# Patient Record
Sex: Female | Born: 1980 | Race: White | Smoking: Never smoker
Health system: Southern US, Community
[De-identification: ages and names within clinical notes are randomized; demographics above are authoritative.]

## PROBLEM LIST (undated history)

## (undated) DIAGNOSIS — R32 Unspecified urinary incontinence: Secondary | ICD-10-CM

## (undated) DIAGNOSIS — B019 Varicella without complication: Secondary | ICD-10-CM

## (undated) DIAGNOSIS — R17 Unspecified jaundice: Secondary | ICD-10-CM

## (undated) HISTORY — PX: NO PAST SURGERIES: SHX2092

## (undated) HISTORY — DX: Unspecified jaundice: R17

## (undated) HISTORY — DX: Unspecified urinary incontinence: R32

## (undated) HISTORY — DX: Varicella without complication: B01.9

---

## 2021-02-15 DIAGNOSIS — L578 Other skin changes due to chronic exposure to nonionizing radiation: Secondary | ICD-10-CM | POA: Diagnosis not present

## 2021-02-15 DIAGNOSIS — L814 Other melanin hyperpigmentation: Secondary | ICD-10-CM | POA: Diagnosis not present

## 2021-02-15 DIAGNOSIS — D1801 Hemangioma of skin and subcutaneous tissue: Secondary | ICD-10-CM | POA: Diagnosis not present

## 2021-02-15 DIAGNOSIS — L700 Acne vulgaris: Secondary | ICD-10-CM | POA: Diagnosis not present

## 2021-07-27 ENCOUNTER — Other Ambulatory Visit: Payer: Self-pay

## 2021-08-07 ENCOUNTER — Telehealth: Payer: Self-pay

## 2021-08-14 ENCOUNTER — Ambulatory Visit: Payer: Federal, State, Local not specified - PPO | Admitting: Family Medicine

## 2021-08-27 NOTE — Telephone Encounter (Signed)
ERROR

## 2021-09-03 ENCOUNTER — Encounter: Payer: Self-pay | Admitting: Family Medicine

## 2021-09-03 ENCOUNTER — Ambulatory Visit: Payer: Federal, State, Local not specified - PPO | Admitting: Family Medicine

## 2021-09-03 VITALS — BP 126/78 | HR 75 | Temp 98.5°F | Ht 61.5 in | Wt 143.0 lb

## 2021-09-03 DIAGNOSIS — Z411 Encounter for cosmetic surgery: Secondary | ICD-10-CM

## 2021-09-03 DIAGNOSIS — Z131 Encounter for screening for diabetes mellitus: Secondary | ICD-10-CM

## 2021-09-03 DIAGNOSIS — Z0000 Encounter for general adult medical examination without abnormal findings: Secondary | ICD-10-CM | POA: Diagnosis not present

## 2021-09-03 DIAGNOSIS — H5210 Myopia, unspecified eye: Secondary | ICD-10-CM

## 2021-09-03 DIAGNOSIS — Z8249 Family history of ischemic heart disease and other diseases of the circulatory system: Secondary | ICD-10-CM | POA: Diagnosis not present

## 2021-09-03 DIAGNOSIS — B001 Herpesviral vesicular dermatitis: Secondary | ICD-10-CM | POA: Diagnosis not present

## 2021-09-03 DIAGNOSIS — Z789 Other specified health status: Secondary | ICD-10-CM

## 2021-09-03 DIAGNOSIS — Z7689 Persons encountering health services in other specified circumstances: Secondary | ICD-10-CM | POA: Diagnosis not present

## 2021-09-03 DIAGNOSIS — R635 Abnormal weight gain: Secondary | ICD-10-CM | POA: Diagnosis not present

## 2021-09-03 DIAGNOSIS — Z1231 Encounter for screening mammogram for malignant neoplasm of breast: Secondary | ICD-10-CM

## 2021-09-03 HISTORY — DX: Encounter for cosmetic surgery: Z41.1

## 2021-09-03 HISTORY — DX: Myopia, unspecified eye: H52.10

## 2021-09-03 MED ORDER — VALACYCLOVIR HCL 1 G PO TABS: 2000.0000 mg | ORAL_TABLET | Freq: Two times a day (BID) | ORAL | 3 refills | Status: DC | PRN

## 2021-09-03 MED ORDER — DROSPIRENONE-ETHINYL ESTRADIOL 3-0.03 MG PO TABS
1.0000 | ORAL_TABLET | Freq: Every day | ORAL | 4 refills | Status: DC
Start: 2021-09-03 — End: 2022-10-21

## 2021-09-03 NOTE — Progress Notes (Signed)
? ?This visit occurred during the SARS-CoV-2 public health emergency.  Safety protocols were in place, including screening questions prior to the visit, additional usage of staff PPE, and extensive cleaning of exam room while observing appropriate contact time as indicated for disinfecting solutions.  ? ? ?Patient ID: Janet Braun, female  DOB: [birth year], 41 y.o.   MRN: [id number] ?Patient Care Team  ?  Relationship Specialty Notifications Start End  ?Ma Hillock, DO PCP - General Family Medicine  09/03/21   ? ? ?Chief Complaint  ?Patient presents with  ? Establish Care  ?  Need refills  ? Annual Exam  ?  Pt is fasting  ? ? ?Subjective: ?Janet Braun is a 41 y.o.  Female  present for CPE/EST care. ?All past medical history, surgical history, allergies, family history, immunizations, medications and social history were updated in the electronic medical record today. ?All recent labs, ED visits and hospitalizations within the last year were reviewed. ? ?Health maintenance:  ?Colonoscopy:No fhx  Routine screen at 66 ?Mammogram: fhx [place]. Ordered today for her.  ?Cervical cancer screening: pt reported about 5 years. 3 mos for PAP here if desired.  ?Immunizations: tdap UTD 2018, Influenza UTD 2022 (encouraged yearly), covid x3 completed ?Infectious disease screening: HIV and Hep C completed ?DEXA:routine screen ?Assistive device: none ?Oxygen TFT:DDUK ?Patient has a Dental home. ?Hospitalizations/ED visits: reviewed ? ? ?  09/03/2021  ?  3:08 PM  ?Depression screen PHQ 2/9  ?Decreased Interest 0  ?Down, Depressed, Hopeless 0  ?PHQ - 2 Score 0  ? ?   ? View : No data to display.  ?  ?  ?  ? ? ?Immunization History  ?Administered Date(s) Administered  ? Hep A / Hep B 07/02/1991, 08/25/1991, 02/25/1992  ? IPV 07/29/2006  ? Influenza Split 02/22/2013, 01/24/2015, 02/07/2016  ? Influenza, Seasonal, Injecte, Preservative Fre 01/22/2012, 02/09/2014  ? Influenza,inj,Quad PF,6+ Mos 02/19/2018  ?  Influenza-Unspecified 02/17/2021  ? MMR 11/05/1982, 01/15/1989, 03/12/2012  ? Meningococcal Conjugate 03/12/2012  ? PPD Test 04/27/2012  ? Td 05/22/1996  ? Td (Adult) 05/22/1996  ? Tdap 09/25/2005, 05/28/2016  ? Varicella 04/19/1988  ? ? ?Past Medical History:  ?Diagnosis Date  ? Chicken pox   ? Encounter for cosmetic surgery 09/03/2021  ? Jaundice   ? at birth (pre-term)  ? Myopia 09/03/2021  ? Urinary incontinence   ? ?Not on File ?Past Surgical History:  ?Procedure Laterality Date  ? NO PAST SURGERIES    ? ?Family History  ?Problem Relation Age of Onset  ? Asthma Mother   ? Thyroid cancer Mother   ? Hypertension Father   ? Hyperlipidemia Father   ? Asthma Brother   ? Heart disease Maternal Grandmother   ? Hypertension Maternal Grandmother   ? Hyperlipidemia Maternal Grandmother   ? Heart attack Maternal Grandmother   ? Heart disease Maternal Grandfather   ? Hypertension Maternal Grandfather   ? Hyperlipidemia Maternal Grandfather   ? Lung cancer Maternal Grandfather   ?     smoker  ? Prostate cancer Maternal Grandfather   ? Heart attack Paternal Grandmother   ? Hypertension Paternal Grandmother   ? Hyperlipidemia Paternal Grandmother   ? Heart disease Paternal Grandmother   ? Uterine cancer Paternal Grandmother   ? Heart disease Paternal Grandfather   ? Hypertension Paternal Grandfather   ? Hyperlipidemia Paternal Grandfather   ? Kidney disease Paternal Grandfather   ? Healthy Daughter   ? Breast cancer Maternal Aunt   ? ?  Social History  ? ?Social History Narrative  ? Marital status/children/pets: Single/Divorced,G2P1  ? Education/employment: MD, employed psychiatrist  ? Safety:   ?   -Wears a bicycle helmet riding a bike: Yes  ?   -smoke alarm in the home:Yes  ?   - wears seatbelt: Yes  ?   - Feels safe in their relationships: Yes  ?   ? ?Allergies as of 09/03/2021   ?Not on File ?  ? ?  ?Medication List  ?  ? ?  ? Accurate as of September 03, 2021  3:42 PM. If you have any questions, ask your nurse or doctor.  ?  ?   ? ?  ? ?drospirenone-ethinyl estradiol 3-0.03 MG tablet ?Commonly known as: Syeda ?Take 1 tablet by mouth daily. ?  ?tretinoin 0.025 % cream ?Commonly known as: RETIN-A ?Apply topically. ?  ?valACYclovir 1000 MG tablet ?Commonly known as: VALTREX ?Take 2 tablets (2,000 mg total) by mouth 2 (two) times daily as needed. Cold sore prn ?  ? ?  ? ? ?All past medical history, surgical history, allergies, family history, immunizations andmedications were updated in the EMR today and reviewed under the history and medication portions of their EMR.    ? ?No results found for this or any previous visit (from the past 2160 hour(s)). ? ?Patient was never admitted. ? ?ROS ?14 pt review of systems performed and negative (unless mentioned in an HPI) ? ?Objective: ?BP 126/78   Pulse 75   Temp 98.5 ?F (36.9 ?C) (Oral)   Ht 5' 1.5" (1.562 m)   Wt 143 lb (64.9 kg)   SpO2 99%   BMI 26.58 kg/m?  ?Physical Exam ?Vitals and nursing note reviewed.  ?Constitutional:   ?   General: She is not in acute distress. ?   Appearance: Normal appearance. She is not ill-appearing or toxic-appearing.  ?HENT:  ?   Head: Normocephalic and atraumatic.  ?   Right Ear: Tympanic membrane, ear canal and external ear normal. There is no impacted cerumen.  ?   Left Ear: Tympanic membrane, ear canal and external ear normal. There is no impacted cerumen.  ?   Nose: No congestion or rhinorrhea.  ?   Mouth/Throat:  ?   Mouth: Mucous membranes are moist.  ?   Pharynx: Oropharynx is clear. No oropharyngeal exudate or posterior oropharyngeal erythema.  ?Eyes:  ?   General: No scleral icterus.    ?   Right eye: No discharge.     ?   Left eye: No discharge.  ?   Extraocular Movements: Extraocular movements intact.  ?   Conjunctiva/sclera: Conjunctivae normal.  ?   Pupils: Pupils are equal, round, and reactive to light.  ?Cardiovascular:  ?   Rate and Rhythm: Normal rate and regular rhythm.  ?   Pulses: Normal pulses.  ?   Heart sounds: Normal heart sounds. No  murmur heard. ?  No friction rub. No gallop.  ?Pulmonary:  ?   Effort: Pulmonary effort is normal. No respiratory distress.  ?   Breath sounds: Normal breath sounds. No stridor. No wheezing, rhonchi or rales.  ?Chest:  ?   Chest wall: No tenderness.  ?Abdominal:  ?   General: Abdomen is flat. Bowel sounds are normal. There is no distension.  ?   Palpations: Abdomen is soft. There is no mass.  ?   Tenderness: There is no abdominal tenderness. There is no right CVA tenderness, left CVA tenderness, guarding or rebound.  ?  Hernia: No hernia is present.  ?Musculoskeletal:     ?   General: No swelling, tenderness or deformity. Normal range of motion.  ?   Cervical back: Normal range of motion and neck supple. No rigidity or tenderness.  ?   Right lower leg: No edema.  ?   Left lower leg: No edema.  ?Lymphadenopathy:  ?   Cervical: No cervical adenopathy.  ?Skin: ?   General: Skin is warm and dry.  ?   Coloration: Skin is not jaundiced or pale.  ?   Findings: No bruising, erythema, lesion or rash.  ?Neurological:  ?   General: No focal deficit present.  ?   Mental Status: She is alert and oriented to person, place, and time. Mental status is at baseline.  ?   Cranial Nerves: No cranial nerve deficit.  ?   Sensory: No sensory deficit.  ?   Motor: No weakness.  ?   Coordination: Coordination normal.  ?   Gait: Gait normal.  ?   Deep Tendon Reflexes: Reflexes normal.  ?Psychiatric:     ?   Mood and Affect: Mood normal.     ?   Behavior: Behavior normal.     ?   Thought Content: Thought content normal.     ?   Judgment: Judgment normal.  ?  ? ?No results found. ? ?Assessment/plan: ?Janet Braun is a 41 y.o. female present for CPE/Establishing care with new doctor, encounter for ?Recurrent cold sores ?Valtrex 2g BID PRN ?Uses oral contraceptives as primary birth control method ?- CBC ?- Comp Met (CMET) ?- Lipid panel ?FH: heart disease ?- Lipid panel ?Breast cancer screening by mammogram ?- MM 3D SCREEN BREAST BILATERAL;  Future ?Diabetes mellitus screening ?- Hemoglobin A1c ?Weight gain ?- TSH ?Encounter for preventive health examination ?Colonoscopy:No fhx  Routine screen at 89 ?Mammogram: [place]x [place]. Ordered today for her.  ?Ce

## 2021-09-03 NOTE — Patient Instructions (Signed)
No follow-ups on file.        Great to see you today.  I have refilled the medication(s) we provide.   If labs were collected, we will inform you of lab results once received either by echart message or telephone call.   - echart message- for normal results that have been seen by the patient already.   - telephone call: abnormal results or if patient has not viewed results in their echart.  Health Maintenance, Female Adopting a healthy lifestyle and getting preventive care are important in promoting health and wellness. Ask your health care provider about: The right schedule for you to have regular tests and exams. Things you can do on your own to prevent diseases and keep yourself healthy. What should I know about diet, weight, and exercise? Eat a healthy diet  Eat a diet that includes plenty of vegetables, fruits, low-fat dairy products, and lean protein. Do not eat a lot of foods that are high in solid fats, added sugars, or sodium. Maintain a healthy weight Body mass index (BMI) is used to identify weight problems. It estimates body fat based on height and weight. Your health care provider can help determine your BMI and help you achieve or maintain a healthy weight. Get regular exercise Get regular exercise. This is one of the most important things you can do for your health. Most adults should: Exercise for at least 150 minutes each week. The exercise should increase your heart rate and make you sweat (moderate-intensity exercise). Do strengthening exercises at least twice a week. This is in addition to the moderate-intensity exercise. Spend less time sitting. Even light physical activity can be beneficial. Watch cholesterol and blood lipids Have your blood tested for lipids and cholesterol at 40 years of age, then have this test every 5 years. Have your cholesterol levels checked more often if: Your lipid or cholesterol levels are high. You are older than 40 years of  age. You are at high risk for heart disease. What should I know about cancer screening? Depending on your health history and family history, you may need to have cancer screening at various ages. This may include screening for: Breast cancer. Cervical cancer. Colorectal cancer. Skin cancer. Lung cancer. What should I know about heart disease, diabetes, and high blood pressure? Blood pressure and heart disease High blood pressure causes heart disease and increases the risk of stroke. This is more likely to develop in people who have high blood pressure readings or are overweight. Have your blood pressure checked: Every 3-5 years if you are 18-39 years of age. Every year if you are 40 years old or older. Diabetes Have regular diabetes screenings. This checks your fasting blood sugar level. Have the screening done: Once every three years after age 40 if you are at a normal weight and have a low risk for diabetes. More often and at a younger age if you are overweight or have a high risk for diabetes. What should I know about preventing infection? Hepatitis B If you have a higher risk for hepatitis B, you should be screened for this virus. Talk with your health care provider to find out if you are at risk for hepatitis B infection. Hepatitis C Testing is recommended for: Everyone born from 1945 through 1965. Anyone with known risk factors for hepatitis C. Sexually transmitted infections (STIs) Get screened for STIs, including gonorrhea and chlamydia, if: You are sexually active and are younger than 40 years of age. You are   older than 40 years of age and your health care provider tells you that you are at risk for this type of infection. Your sexual activity has changed since you were last screened, and you are at increased risk for chlamydia or gonorrhea. Ask your health care provider if you are at risk. Ask your health care provider about whether you are at high risk for HIV. Your health  care provider may recommend a prescription medicine to help prevent HIV infection. If you choose to take medicine to prevent HIV, you should first get tested for HIV. You should then be tested every 3 months for as long as you are taking the medicine. Pregnancy If you are about to stop having your period (premenopausal) and you may become pregnant, seek counseling before you get pregnant. Take 400 to 800 micrograms (mcg) of folic acid every day if you become pregnant. Ask for birth control (contraception) if you want to prevent pregnancy. Osteoporosis and menopause Osteoporosis is a disease in which the bones lose minerals and strength with aging. This can result in bone fractures. If you are 65 years old or older, or if you are at risk for osteoporosis and fractures, ask your health care provider if you should: Be screened for bone loss. Take a calcium or vitamin D supplement to lower your risk of fractures. Be given hormone replacement therapy (HRT) to treat symptoms of menopause. Follow these instructions at home: Alcohol use Do not drink alcohol if: Your health care provider tells you not to drink. You are pregnant, may be pregnant, or are planning to become pregnant. If you drink alcohol: Limit how much you have to: 0-1 drink a day. Know how much alcohol is in your drink. In the [place] one drink equals one 12 oz bottle of beer (355 mL), one 5 oz glass of wine (148 mL), or one 1 oz glass of hard liquor (44 mL). Lifestyle Do not use any products that contain nicotine or tobacco. These products include cigarettes, chewing tobacco, and vaping devices, such as e-cigarettes. If you need help quitting, ask your health care provider. Do not use street drugs. Do not share needles. Ask your health care provider for help if you need support or information about quitting drugs. General instructions Schedule regular health, dental, and eye exams. Stay current with your vaccines. Tell your health  care provider if: You often feel depressed. You have ever been abused or do not feel safe at home. Summary Adopting a healthy lifestyle and getting preventive care are important in promoting health and wellness. Follow your health care provider's instructions about healthy diet, exercising, and getting tested or screened for diseases. Follow your health care provider's instructions on monitoring your cholesterol and blood pressure. This information is not intended to replace advice given to you by your health care provider. Make sure you discuss any questions you have with your health care provider. Document Revised: 09/25/2020 Document Reviewed: 09/25/2020 Elsevier Patient Education  2023 Elsevier Inc[employer]  

## 2021-09-04 LAB — COMPREHENSIVE METABOLIC PANEL
AG Ratio: 1.4 (calc) (ref 1.0–2.5)
ALT: 13 U/L (ref 6–29)
AST: 13 U/L (ref 10–30)
Albumin: 4.2 g/dL (ref 3.6–5.1)
Alkaline phosphatase (APISO): 34 U/L (ref 31–125)
BUN/Creatinine Ratio: 16 (calc) (ref 6–22)
BUN: 16 mg/dL (ref 7–25)
CO2: 26 mmol/L (ref 20–32)
Calcium: 9.6 mg/dL (ref 8.6–10.2)
Chloride: 104 mmol/L (ref 98–110)
Creat: 1.02 mg/dL — ABNORMAL HIGH (ref 0.50–0.99)
Globulin: 2.9 g/dL (calc) (ref 1.9–3.7)
Glucose, Bld: 79 mg/dL (ref 65–99)
Potassium: 4.1 mmol/L (ref 3.5–5.3)
Sodium: 138 mmol/L (ref 135–146)
Total Bilirubin: 0.4 mg/dL (ref 0.2–1.2)
Total Protein: 7.1 g/dL (ref 6.1–8.1)

## 2021-09-04 LAB — LIPID PANEL
Cholesterol: 177 mg/dL (ref <200)
HDL: 110 mg/dL (ref >=50)
LDL Cholesterol (Calc): 52 mg/dL (calc)
Non-HDL Cholesterol (Calc): 67 mg/dL (calc) (ref <130)
Total CHOL/HDL Ratio: 1.6 (calc) (ref <5.0)
Triglycerides: 72 mg/dL (ref <150)

## 2021-09-04 LAB — CBC
HCT: 39.8 % (ref 35.0–45.0)
Hemoglobin: 13.6 g/dL (ref 11.7–15.5)
MCH: 31.9 pg (ref 27.0–33.0)
MCHC: 34.2 g/dL (ref 32.0–36.0)
MCV: 93.2 fL (ref 80.0–100.0)
MPV: 11.4 fL (ref 7.5–12.5)
Platelets: 243 10*3/uL (ref 140–400)
RBC: 4.27 10*6/uL (ref 3.80–5.10)
RDW: 12.1 % (ref 11.0–15.0)
WBC: 7.2 10*3/uL (ref 3.8–10.8)

## 2021-09-04 LAB — HEMOGLOBIN A1C
Hgb A1c MFr Bld: 5 % of total Hgb (ref <5.7)
Mean Plasma Glucose: 97 mg/dL
eAG (mmol/L): 5.4 mmol/L

## 2021-09-04 LAB — TSH: TSH: 0.94 mIU/L

## 2021-09-17 ENCOUNTER — Ambulatory Visit
Admission: RE | Admit: 2021-09-17 | Discharge: 2021-09-17 | Disposition: A | Discharge status: Home / Self Care | Payer: Federal, State, Local not specified - PPO | Source: Ambulatory Visit | Attending: Family Medicine | Admitting: Family Medicine

## 2021-09-17 DIAGNOSIS — Z1231 Encounter for screening mammogram for malignant neoplasm of breast: Secondary | ICD-10-CM | POA: Diagnosis not present

## 2021-09-19 ENCOUNTER — Other Ambulatory Visit: Payer: Self-pay | Admitting: Family Medicine

## 2021-09-19 DIAGNOSIS — R928 Other abnormal and inconclusive findings on diagnostic imaging of breast: Secondary | ICD-10-CM

## 2021-09-22 ENCOUNTER — Ambulatory Visit
Admission: RE | Admit: 2021-09-22 | Discharge: 2021-09-22 | Disposition: A | Discharge status: Home / Self Care | Payer: Federal, State, Local not specified - PPO | Source: Ambulatory Visit | Attending: Family Medicine | Admitting: Family Medicine

## 2021-09-22 ENCOUNTER — Other Ambulatory Visit: Payer: Self-pay | Admitting: Family Medicine

## 2021-09-22 DIAGNOSIS — R928 Other abnormal and inconclusive findings on diagnostic imaging of breast: Secondary | ICD-10-CM

## 2021-09-22 DIAGNOSIS — N6489 Other specified disorders of breast: Secondary | ICD-10-CM | POA: Diagnosis not present

## 2021-09-22 DIAGNOSIS — N6320 Unspecified lump in the left breast, unspecified quadrant: Secondary | ICD-10-CM

## 2021-11-27 ENCOUNTER — Encounter: Payer: Self-pay | Admitting: Family Medicine

## 2021-12-04 ENCOUNTER — Encounter: Payer: Self-pay | Admitting: Family Medicine

## 2021-12-04 ENCOUNTER — Telehealth: Payer: Federal, State, Local not specified - PPO | Admitting: Family Medicine

## 2021-12-04 DIAGNOSIS — F439 Reaction to severe stress, unspecified: Secondary | ICD-10-CM | POA: Diagnosis not present

## 2021-12-04 HISTORY — DX: Reaction to severe stress, unspecified: F43.9

## 2021-12-04 MED ORDER — ESCITALOPRAM OXALATE 10 MG PO TABS: 10.0000 mg | ORAL_TABLET | Freq: Every day | ORAL | 1 refills | Status: DC

## 2021-12-04 MED ORDER — HYDROXYZINE HCL 10 MG PO TABS: 5.0000 mg | ORAL_TABLET | Freq: Two times a day (BID) | ORAL | 5 refills | Status: DC | PRN

## 2021-12-04 NOTE — Progress Notes (Signed)
VIRTUAL VISIT VIA VIDEO  I connected with Janet Braun on 12/06/21 at  2:40 PM EDT by a video enabled telemedicine application and verified that I am speaking with the correct person using two identifiers. Location patient: Home Location provider: [facility], Office Persons participating in the virtual visit: Patient, Dr. Claiborne Billings and Les Pou, CMA  I discussed the limitations of evaluation and management by telemedicine and the availability of in person appointments. The patient expressed understanding and agreed to proceed.     Janet Braun , [birth year], 41 y.o., female MRN: [id number] Patient Care Team    Relationship Specialty Notifications Start End  Natalia Leatherwood, DO PCP - General Family Medicine  09/03/21     Chief Complaint  Patient presents with   Stress    Pt c/o inc stress x 1.5      Subjective: Pt presents for an OV with complaints of increased stress.  She has been experiencing many changes in stressors in her life over the last few months.  She recently moved to the area and started a new job.  She is in the process of purchasing a house.  She is having difficulty managing the stressors surrounding her ex-husband, which struggles with alcohol use.  She has never been prescribed a medication to help manage stress.  She has not felt this overwhelmed in the past, but has recognized signs that she should seek help for her stress.     12/04/2021    2:38 PM 09/03/2021    3:08 PM  Depression screen PHQ 2/9  Decreased Interest 0 0  Down, Depressed, Hopeless 0 0  PHQ - 2 Score 0 0  Altered sleeping 1   Tired, decreased energy 2   Change in appetite 0   Feeling bad or failure about yourself  3   Trouble concentrating 2   Moving slowly or fidgety/restless 0   Suicidal thoughts 0   PHQ-9 Score 8       12/04/2021    2:39 PM  GAD 7 : Generalized Anxiety Score  Nervous, Anxious, on Edge 3  Control/stop worrying 2  Worry too much - different  things 1  Trouble relaxing 3  Restless 0  Easily annoyed or irritable 2  Afraid - awful might happen 2  Total GAD 7 Score 13      Not on File Social History   Social History Narrative   Marital status/children/pets: Single/Divorced,G2P1   Education/employment: MD, employed Radiographer, therapeutic:      -Wears a bicycle helmet riding a bike: Yes     -smoke alarm in the home:Yes     - wears seatbelt: Yes     - Feels safe in their relationships: Yes      Past Medical History:  Diagnosis Date   Chicken pox    Encounter for cosmetic surgery 09/03/2021   Jaundice    at birth (pre-term)   Myopia 09/03/2021   Urinary incontinence    Past Surgical History:  Procedure Laterality Date   NO PAST SURGERIES     Family History  Problem Relation Age of Onset   Asthma Mother    Thyroid cancer Mother    Hypertension Father    Hyperlipidemia Father    Asthma Brother    Heart disease Maternal Grandmother    Hypertension Maternal Grandmother    Hyperlipidemia Maternal Grandmother    Heart attack Maternal Grandmother    Heart disease Maternal Grandfather  Hypertension Maternal Grandfather    Hyperlipidemia Maternal Grandfather    Lung cancer Maternal Grandfather        smoker   Prostate cancer Maternal Grandfather    Heart attack Paternal Grandmother    Hypertension Paternal Grandmother    Hyperlipidemia Paternal Grandmother    Heart disease Paternal Grandmother    Uterine cancer Paternal Grandmother    Heart disease Paternal Grandfather    Hypertension Paternal Grandfather    Hyperlipidemia Paternal Grandfather    Kidney disease Paternal Grandfather    Healthy Daughter    Breast cancer Maternal Aunt    Allergies as of 12/04/2021   Not on File      Medication List        Accurate as of December 04, 2021 11:59 PM. If you have any questions, ask your nurse or doctor.          drospirenone-ethinyl estradiol 3-0.03 MG tablet Commonly known as: Syeda Take 1 tablet by  mouth daily.   escitalopram 10 MG tablet Commonly known as: Lexapro Take 1 tablet (10 mg total) by mouth daily. Started by: Felix Pacini, DO   hydrOXYzine 10 MG tablet Commonly known as: ATARAX Take 0.5-2 tablets (5-20 mg total) by mouth 2 (two) times daily as needed. Started by: Felix Pacini, DO   tretinoin 0.025 % cream Commonly known as: RETIN-A Apply topically.   valACYclovir 1000 MG tablet Commonly known as: VALTREX Take 2 tablets (2,000 mg total) by mouth 2 (two) times daily as needed. Cold sore prn        All past medical history, surgical history, allergies, family history, immunizations andmedications were updated in the EMR today and reviewed under the history and medication portions of their EMR.     ROS Negative, with the exception of above mentioned in HPI   Objective:  There were no vitals taken for this visit. There is no height or weight on file to calculate BMI. Physical Exam Vitals and nursing note reviewed.  Constitutional:      General: She is not in acute distress.    Appearance: Normal appearance. She is normal weight. She is not ill-appearing or toxic-appearing.  Eyes:     Extraocular Movements: Extraocular movements intact.     Conjunctiva/sclera: Conjunctivae normal.     Pupils: Pupils are equal, round, and reactive to light.  Neurological:     Mental Status: She is alert and oriented to person, place, and time. Mental status is at baseline.  Psychiatric:        Mood and Affect: Mood normal. Affect is tearful.        Speech: Speech normal.        Behavior: Behavior normal. Behavior is cooperative.        Thought Content: Thought content normal. Thought content is not paranoid or delusional. Thought content does not include homicidal or suicidal ideation. Thought content does not include homicidal or suicidal plan.        Cognition and Memory: Cognition and memory normal.        Judgment: Judgment normal.    No results found. No results  found. No results found for this or any previous visit (from the past 24 hour(s)).  Assessment/Plan: Janet Braun is a 41 y.o. female present for OV for  Situational stress Medication benefits were discussed today.  She is initially hesitant, but agreeable to start medication.  She understands she should remain on this medicine for at least 6 months and then consider tapering  off if desired or continuing. Start Lexapro 10 mg daily Start hydroxyzine 5-10 mg for situational stress/panic and 10-30 mg nightly as needed Discussed benefits of therapy.  Patient is considering discussing with a therapist.  She would need to be selective of therapist location given her profession.  She will look into this in more detail and if requiring referral we will place that for her. Follow-up in 5.5 months, sooner if needed  Reviewed expectations re: course of current medical issues. Discussed self-management of symptoms. Outlined signs and symptoms indicating need for more acute intervention. Patient verbalized understanding and all questions were answered. Patient received an After-Visit Summary.    No orders of the defined types were placed in this encounter.  Meds ordered this encounter  Medications   escitalopram (LEXAPRO) 10 MG tablet    Sig: Take 1 tablet (10 mg total) by mouth daily.    Dispense:  90 tablet    Refill:  1   hydrOXYzine (ATARAX) 10 MG tablet    Sig: Take 0.5-2 tablets (5-20 mg total) by mouth 2 (two) times daily as needed.    Dispense:  90 tablet    Refill:  5   Referral Orders  No referral(s) requested today     Note is dictated utilizing voice recognition software. Although note has been proof read prior to signing, occasional typographical errors still can be missed. If any questions arise, please do not hesitate to call for verification.   electronically signed by:  Felix Pacini, DO  [facility] [place]

## 2021-12-06 ENCOUNTER — Encounter: Payer: Self-pay | Admitting: Family Medicine

## 2021-12-06 NOTE — Patient Instructions (Signed)
Managing Stress, Adult Feeling a certain amount of stress is normal. Stress helps our body and mind get ready to deal with the demands of life. Stress hormones can motivate you to do well at work and meet your responsibilities. But severe or long-term (chronic) stress can affect your mental and physical health. Chronic stress puts you at higher risk for: Anxiety and depression. Other health problems such as digestive problems, muscle aches, heart disease, high blood pressure, and stroke. What are the causes? Common causes of stress include: Demands from work, such as deadlines, feeling overworked, or having long hours. Pressures at home, such as money issues, disagreements with a spouse, or parenting issues. Pressures from major life changes, such as divorce, moving, loss of a loved one, or chronic illness. You may be at higher risk for stress-related problems if you: Do not get enough sleep. Are in poor health. Do not have emotional support. Have a mental health disorder such as anxiety or depression. How to recognize stress Stress can make you: Have trouble sleeping. Feel sad, anxious, irritable, or overwhelmed. Lose your appetite. Overeat or want to eat unhealthy foods. Want to use drugs or alcohol. Stress can also cause physical symptoms, such as: Sore, tense muscles, especially in the shoulders and neck. Headaches. Trouble breathing. A faster heart rate. Stomach pain, nausea, or vomiting. Diarrhea or constipation. Trouble concentrating. Follow these instructions at home: Eating and drinking Eat a healthy diet. This includes: Eating foods that are high in fiber, such as beans, whole grains, and fresh fruits and vegetables. Limiting foods that are high in fat and processed sugars, such as fried or sweet foods. Do not skip meals or overeat. Drink enough fluid to keep your urine pale yellow. Alcohol use Do not drink alcohol if: Your health care provider tells you not to  drink. You are pregnant, may be pregnant, or are planning to become pregnant. Drinking alcohol is a way some people try to ease their stress. This can be dangerous, so if you drink alcohol: Limit how much you have to: 0-1 drink a day for women. 0-2 drinks a day for men. Know how much alcohol is in your drink. In the [place] one drink equals one 12 oz bottle of beer (355 mL), one 5 oz glass of wine (148 mL), or one 1 oz glass of hard liquor (44 mL). Activity  Include 30 minutes of exercise in your daily schedule. Exercise is a good stress reducer. Include time in your day for an activity that you find relaxing. Try taking a walk, going on a bike ride, reading a book, or listening to music. Schedule your time in a way that lowers stress, and keep a regular schedule. Focus on doing what is most important to get done. Lifestyle Identify the source of your stress and your reaction to it. See a therapist who can help you change unhelpful reactions. When there are stressful events: Talk about them with family, friends, or coworkers. Try to think realistically about stressful events and not ignore them or overreact. Try to find the positives in a stressful situation and not focus on the negatives. Cut back on responsibilities at work and home, if possible. Ask for help from friends or family members if you need it. Find ways to manage stress, such as: Mindfulness, meditation, or deep breathing. Yoga or tai chi. Progressive muscle relaxation. Spending time in nature. Doing art, playing music, or reading. Making time for fun activities. Spending time with family and friends. Get support  from family, friends, or spiritual resources. General instructions Get enough sleep. Try to go to sleep and get up at about the same time every day. Take over-the-counter and prescription medicines only as told by your health care provider. Do not use any products that contain nicotine or tobacco. These products  include cigarettes, chewing tobacco, and vaping devices, such as e-cigarettes. If you need help quitting, ask your health care provider. Do not use drugs or smoke to deal with stress. Keep all follow-up visits. This is important. Where to find support Talk with your health care provider about stress management or finding a support group. Find a therapist to work with you on your stress management techniques. Where to find more information [employer] on Mental Illness: www.nami.org American Psychological Association: TVStere[employer].ch Contact a health care provider if: Your stress symptoms get worse. You are unable to manage your stress at home. You are struggling to stop using drugs or alcohol. Get help right away if: You may be a danger to yourself or others. You have any thoughts of death or suicide. Get help right awayif you feel like you may hurt yourself or others, or have thoughts about taking your own life. Go to your nearest emergency room or: Call 911. Call the [facility] at 289-837-1647 or 988 in the [place] This is open 24 hours a day. Text the Crisis Text Line at (726)508-3618. Summary Feeling a certain amount of stress is normal, but severe or long-term (chronic) stress can affect your mental and physical health. Chronic stress can put you at higher risk for anxiety, depression, and other health problems such as digestive problems, muscle aches, heart disease, high blood pressure, and stroke. You may be at higher risk for stress-related problems if you do not get enough sleep, are in poor health, lack emotional support, or have a mental health disorder such as anxiety or depression. Identify the source of your stress and your reaction to it. Try talking about stressful events with family, friends, or coworkers, finding a coping method, or getting support from spiritual resources. If you need more help, talk with your health care provider about finding a  support group or a mental health therapist. This information is not intended to replace advice given to you by your health care provider. Make sure you discuss any questions you have with your health care provider. Document Revised: 11/30/2020 Document Reviewed: 11/28/2020 Elsevier Patient Education  [place].

## 2021-12-10 ENCOUNTER — Encounter: Payer: Self-pay | Admitting: Family Medicine

## 2021-12-10 ENCOUNTER — Ambulatory Visit: Payer: Federal, State, Local not specified - PPO | Admitting: Family Medicine

## 2021-12-10 ENCOUNTER — Other Ambulatory Visit (HOSPITAL_COMMUNITY)
Admission: RE | Admit: 2021-12-10 | Discharge: 2021-12-10 | Disposition: A | Discharge status: Home / Self Care | Payer: Federal, State, Local not specified - PPO | Source: Ambulatory Visit | Attending: Family Medicine | Admitting: Family Medicine

## 2021-12-10 VITALS — BP 112/73 | HR 74 | Temp 98.4°F | Ht 61.5 in | Wt 140.0 lb

## 2021-12-10 DIAGNOSIS — Z01419 Encounter for gynecological examination (general) (routine) without abnormal findings: Secondary | ICD-10-CM | POA: Insufficient documentation

## 2021-12-10 NOTE — Progress Notes (Signed)
Patient ID: Janet Braun, female  DOB: [birth year], 41 y.o.   MRN: [id number] Patient Care Team    Relationship Specialty Notifications Start End  Ma Hillock, DO PCP - General Family Medicine  09/03/21     Chief Complaint  Patient presents with   Gynecologic Exam    Subjective:  Janet Braun is a 41 y.o.  female present for well women exam.  Mammogram: completed:09/2021-repeat necessary with likely benign findings.  Recommendations of repeat mammogram in 6 months. Patient's last menstrual period was 11/26/2021.  Menstrual cycles are approximately 30 days in length, regular, last about 4 to 6 days and not heavy.  She is compliant with Syeda BCP.      12/04/2021    2:38 PM 09/03/2021    3:08 PM  Depression screen PHQ 2/9  Decreased Interest 0 0  Down, Depressed, Hopeless 0 0  PHQ - 2 Score 0 0  Altered sleeping 1   Tired, decreased energy 2   Change in appetite 0   Feeling bad or failure about yourself  3   Trouble concentrating 2   Moving slowly or fidgety/restless 0   Suicidal thoughts 0   PHQ-9 Score 8       12/04/2021    2:39 PM  GAD 7 : Generalized Anxiety Score  Nervous, Anxious, on Edge 3  Control/stop worrying 2  Worry too much - different things 1  Trouble relaxing 3  Restless 0  Easily annoyed or irritable 2  Afraid - awful might happen 2  Total GAD 7 Score 13           No data to display          Immunization History  Administered Date(s) Administered   Hep A / Hep B 07/02/1991, 08/25/1991, 02/25/1992   IPV 07/29/2006   Influenza Split 02/22/2013, 01/24/2015, 02/07/2016   Influenza, Seasonal, Injecte, Preservative Fre 01/22/2012, 02/09/2014   Influenza,inj,Quad PF,6+ Mos 02/19/2018   Influenza-Unspecified 02/17/2021   MMR 11/05/1982, 01/15/1989, 03/12/2012   Meningococcal Conjugate 03/12/2012   Moderna Covid-19 Vaccine Bivalent Booster 36yr & up 03/20/2021   Moderna SARS-COV2 Booster Vaccination 02/04/2020   Moderna  Sars-Covid-2 Vaccination 05/21/2019, 06/18/2019   PPD Test 04/27/2012   Td 05/22/1996   Td (Adult) 05/22/1996   Tdap 09/25/2005, 05/28/2016   Varicella 04/19/1988    No results found.  Past Medical History:  Diagnosis Date   Chicken pox    Encounter for cosmetic surgery 09/03/2021   Jaundice    at birth (pre-term)   Myopia 09/03/2021   Urinary incontinence    Not on File Past Surgical History:  Procedure Laterality Date   NO PAST SURGERIES     Family History  Problem Relation Age of Onset   Asthma Mother    Thyroid cancer Mother    Hypertension Father    Hyperlipidemia Father    Asthma Brother    Heart disease Maternal Grandmother    Hypertension Maternal Grandmother    Hyperlipidemia Maternal Grandmother    Heart attack Maternal Grandmother    Heart disease Maternal Grandfather    Hypertension Maternal Grandfather    Hyperlipidemia Maternal Grandfather    Lung cancer Maternal Grandfather        smoker   Prostate cancer Maternal Grandfather    Heart attack Paternal Grandmother    Hypertension Paternal Grandmother    Hyperlipidemia Paternal Grandmother    Heart disease Paternal Grandmother    Uterine cancer Paternal Grandmother  Heart disease Paternal Grandfather    Hypertension Paternal Grandfather    Hyperlipidemia Paternal Grandfather    Kidney disease Paternal Grandfather    Healthy Daughter    Breast cancer Maternal Aunt    Social History   Social History Narrative   Marital status/children/pets: Single/Divorced,G2P1   Education/employment: MD, employed Agricultural consultant:      -Wears a bicycle helmet riding a bike: Yes     -smoke alarm in the home:Yes     - wears seatbelt: Yes     - Feels safe in their relationships: Yes       Allergies as of 12/10/2021   Not on File      Medication List        Accurate as of December 10, 2021  6:44 PM. If you have any questions, ask your nurse or doctor.          drospirenone-ethinyl estradiol  3-0.03 MG tablet Commonly known as: Syeda Take 1 tablet by mouth daily.   escitalopram 10 MG tablet Commonly known as: Lexapro Take 1 tablet (10 mg total) by mouth daily.   hydrOXYzine 10 MG tablet Commonly known as: ATARAX Take 0.5-2 tablets (5-20 mg total) by mouth 2 (two) times daily as needed.   tretinoin 0.025 % cream Commonly known as: RETIN-A Apply topically.   valACYclovir 1000 MG tablet Commonly known as: VALTREX Take 2 tablets (2,000 mg total) by mouth 2 (two) times daily as needed. Cold sore prn        All past medical history, surgical history, allergies, family history, immunizations andmedications were updated in the EMR today and reviewed under the history and medication portions of their EMR.    No results found for this or any previous visit (from the past 2160 hour(s)).  MM DIAG BREAST TOMO UNI LEFT  Result Date: 09/22/2021 CLINICAL DATA:  Patient returns today to further characterize a possible asymmetry identified on recent baseline screening mammogram. EXAM: DIGITAL DIAGNOSTIC UNILATERAL LEFT MAMMOGRAM WITH TOMOSYNTHESIS AND CAD; ULTRASOUND LEFT BREAST LIMITED TECHNIQUE: Left digital IMPRESSION: Probably benign island of normal dense fibroglandular tissue within the outer LEFT breast, with a normal ultrasound evaluation of the outer LEFT breast. Recommend follow-up LEFT breast diagnostic mammogram in 6 months to ensure stability. RECOMMENDATION: LEFT breast diagnostic mammogram in 6 months. I have discussed the findings and recommendations with the patient. If applicable, a reminder letter will be sent to the patient regarding the next appointment. BI-RADS CATEGORY  3: Probably benign. Electronically Signed   By: Franki Cabot M.D.   On: 09/22/2021 08:01  US BREAST LTD UNI LEFT INC AXILLA  IMPRESSION: Probably benign island of normal dense fibroglandular tissue within the outer LEFT breast, with a normal ultrasound evaluation of the outer LEFT breast. Recommend  follow-up LEFT breast diagnostic mammogram in 6 months to ensure stability. RECOMMENDATION: LEFT breast diagnostic mammogram in 6 months. I have discussed the findings and recommendations with the patient. If applicable, a reminder letter will be sent to the patient regarding the next appointment. BI-RADS CATEGORY  3: Probably benign. Electronically Signed   By: Franki Cabot M.D.   On: 09/22/2021 08:01    Review of Systems  Genitourinary:  Negative for dysuria, frequency, hematuria and urgency.   14 pt review of systems performed and negative (unless mentioned in an HPI)  Objective: BP 112/73   Pulse 74   Temp 98.4 F (36.9 C) (Oral)   Ht 5' 1.5" (1.562 m)   Wt 140 lb (  63.5 kg)   LMP 11/26/2021   SpO2 100%   BMI 26.02 kg/m  Physical Exam Vitals and nursing note reviewed.  Constitutional:      General: She is not in acute distress.    Appearance: Normal appearance. She is not ill-appearing, toxic-appearing or diaphoretic.  HENT:     Head: Normocephalic and atraumatic.  Eyes:     General: No scleral icterus.       Right eye: No discharge.        Left eye: No discharge.     Extraocular Movements: Extraocular movements intact.     Conjunctiva/sclera: Conjunctivae normal.     Pupils: Pupils are equal, round, and reactive to light.  Cardiovascular:     Rate and Rhythm: Normal rate and regular rhythm.  Pulmonary:     Effort: Pulmonary effort is normal.     Breath sounds: Normal breath sounds.  Abdominal:     General: Abdomen is flat. Bowel sounds are normal. There is no distension.     Palpations: Abdomen is soft. There is no mass.     Tenderness: There is no abdominal tenderness. There is no guarding or rebound.     Hernia: No hernia is present.  Musculoskeletal:        General: Normal range of motion.     Cervical back: Normal range of motion.  Skin:    General: Skin is warm and dry.     Coloration: Skin is not jaundiced or pale.     Findings: No erythema or rash.   Neurological:     Mental Status: She is alert and oriented to person, place, and time. Mental status is at baseline.     Motor: No weakness.     Gait: Gait normal.  Psychiatric:        Mood and Affect: Mood normal.        Behavior: Behavior normal.        Thought Content: Thought content normal.        Judgment: Judgment normal.    Breasts: breasts appear normal, symmetrical, no tenderness on exam, no suspicious masses, no skin or nipple changes or axillary nodes. GYN:  External genitalia within normal limits, normal hair distribution, no lesions. Urethral meatus normal, no lesions. Vaginal mucosa pink, moist, normal rugae, no lesions. No cystocele or rectocele. cervix without lesions, no discharge. Bimanual exam revealed normal uterus.  No bladder/suprapubic fullness, masses or tenderness. No cervical motion tenderness. No adnexal fullness. Anus and perineum within normal limits, no lesions.     Assessment/plan: Janet Braun is a 41 y.o. female present for  Encounter for routine gynecological examination with Papanicolaou smear of cervix Patient has not had a cervical cancer screening in many years. Cervical cancer screening completed today with HPV cotesting. - Cytology - PAP( [place]) Patient will be called with results and further screenings discussed at that time.    Return if symptoms worsen or fail to improve.  No orders of the defined types were placed in this encounter.  No orders of the defined types were placed in this encounter.  Referral Orders  No referral(s) requested today     Note is dictated utilizing voice recognition software. Although note has been proof read prior to signing, occasional typographical errors still can be missed. If any questions arise, please do not hesitate to call for verification.  Electronically signed by: Howard Pouch, DO [place]

## 2021-12-10 NOTE — Patient Instructions (Signed)
Pap Test Why am I having this test? A Pap test, also called a Pap smear, is a screening test to check for signs of: Infection. Cancer of the cervix. The cervix is the lower part of the uterus that opens into the vagina. Changes that may be a sign that cancer is developing (precancerous changes). Women need this test on a regular basis. In general, you should have a Pap test every 3 years until you reach menopause or age 40. Women aged 30-60 may choose to have their Pap test done at the same time as an HPV (human papillomavirus) test every 5 years (instead of every 3 years). Your health care provider may recommend having Pap tests more or less often depending on your medical conditions and past Pap test results. What is being tested? Cervical cells are tested for signs of infection or abnormalities. What kind of sample is taken?  Your health care provider will collect a sample of cells from the surface of your cervix. This will be done using a small cotton swab, plastic spatula, or brush that is inserted into your vagina using a tool called a speculum. This sample is often collected during a pelvic exam, when you are lying on your back on an exam table with your feet in footrests (stirrups). In some cases, fluids (secretions) from the cervix or vagina may also be collected. How do I prepare for this test? Be aware of where you are in your menstrual cycle. If you are menstruating on the day of the test, you may be asked to reschedule. You may need to reschedule if you have a known vaginal infection on the day of the test. Follow instructions from your health care provider about: Changing or stopping your regular medicines. Some medicines can cause abnormal test results, such as vaginal medicines and tetracycline. Avoiding douching 2-3 days before or the day of the test. Tell a health care provider about: Any allergies you have. All medicines you are taking, including vitamins, herbs, eye drops,  creams, and over-the-counter medicines. Any bleeding problems you have. Any surgeries you have had. Any medical conditions you have. Whether you are pregnant or may be pregnant. How are the results reported? Your test results will be reported as either abnormal or normal. What do the results mean? A normal test result means that you do not have signs of cancer of the cervix. An abnormal result may mean that you have: Cancer. A Pap test by itself is not enough to diagnose cancer. You will have more tests done if cancer is suspected. Precancerous changes in your cervix. Inflammation of the cervix. An STI (sexually transmitted infection). A fungal infection. A parasite infection. Talk with your health care provider about what your results mean. In some cases, your health care provider may do more testing to confirm the results. Questions to ask your health care provider Ask your health care provider, or the department that is doing the test: When will my results be ready? How will I get my results? What are my treatment options? What other tests do I need? What are my next steps? Summary In general, women should have a Pap test every 3 years until they reach menopause or age 40. Your health care provider will collect a sample of cells from the surface of your cervix. This will be done using a small cotton swab, plastic spatula, or brush. In some cases, fluids (secretions) from the cervix or vagina may also be collected. This information is not   intended to replace advice given to you by your health care provider. Make sure you discuss any questions you have with your health care provider. Document Revised: 08/04/2020 Document Reviewed: 08/04/2020 Elsevier Patient Education  2023 [employer] Inc.  

## 2021-12-14 LAB — CYTOLOGY - PAP
Comment: NEGATIVE
Diagnosis: NEGATIVE
High risk HPV: NEGATIVE

## 2022-02-18 DIAGNOSIS — L814 Other melanin hyperpigmentation: Secondary | ICD-10-CM | POA: Diagnosis not present

## 2022-02-18 DIAGNOSIS — D1801 Hemangioma of skin and subcutaneous tissue: Secondary | ICD-10-CM | POA: Diagnosis not present

## 2022-02-18 DIAGNOSIS — D229 Melanocytic nevi, unspecified: Secondary | ICD-10-CM | POA: Diagnosis not present

## 2022-02-18 DIAGNOSIS — L578 Other skin changes due to chronic exposure to nonionizing radiation: Secondary | ICD-10-CM | POA: Diagnosis not present

## 2022-03-29 ENCOUNTER — Ambulatory Visit
Admission: RE | Admit: 2022-03-29 | Discharge: 2022-03-29 | Disposition: A | Discharge status: Home / Self Care | Payer: Federal, State, Local not specified - PPO | Source: Ambulatory Visit | Attending: Family Medicine | Admitting: Family Medicine

## 2022-03-29 ENCOUNTER — Other Ambulatory Visit: Payer: Self-pay | Admitting: Family Medicine

## 2022-03-29 ENCOUNTER — Ambulatory Visit: Payer: Federal, State, Local not specified - PPO

## 2022-03-29 DIAGNOSIS — N6320 Unspecified lump in the left breast, unspecified quadrant: Secondary | ICD-10-CM

## 2022-03-29 DIAGNOSIS — R92322 Mammographic fibroglandular density, left breast: Secondary | ICD-10-CM | POA: Diagnosis not present

## 2022-05-20 DIAGNOSIS — J029 Acute pharyngitis, unspecified: Secondary | ICD-10-CM | POA: Diagnosis not present

## 2022-05-20 DIAGNOSIS — R051 Acute cough: Secondary | ICD-10-CM | POA: Diagnosis not present

## 2022-09-20 ENCOUNTER — Other Ambulatory Visit: Payer: Self-pay | Admitting: Family Medicine

## 2022-09-20 ENCOUNTER — Ambulatory Visit
Admission: RE | Admit: 2022-09-20 | Discharge: 2022-09-20 | Disposition: A | Discharge status: Home / Self Care | Payer: Federal, State, Local not specified - PPO | Source: Ambulatory Visit | Attending: Family Medicine | Admitting: Family Medicine

## 2022-09-20 DIAGNOSIS — N63 Unspecified lump in unspecified breast: Secondary | ICD-10-CM | POA: Diagnosis not present

## 2022-09-20 DIAGNOSIS — Z1231 Encounter for screening mammogram for malignant neoplasm of breast: Secondary | ICD-10-CM | POA: Diagnosis not present

## 2022-09-20 DIAGNOSIS — N6320 Unspecified lump in the left breast, unspecified quadrant: Secondary | ICD-10-CM

## 2022-09-23 ENCOUNTER — Other Ambulatory Visit: Payer: Self-pay | Admitting: Family Medicine

## 2022-09-23 DIAGNOSIS — N6320 Unspecified lump in the left breast, unspecified quadrant: Secondary | ICD-10-CM

## 2022-10-01 ENCOUNTER — Encounter: Payer: Self-pay | Admitting: Family Medicine

## 2022-10-04 DIAGNOSIS — F4323 Adjustment disorder with mixed anxiety and depressed mood: Secondary | ICD-10-CM | POA: Diagnosis not present

## 2022-10-18 DIAGNOSIS — F4323 Adjustment disorder with mixed anxiety and depressed mood: Secondary | ICD-10-CM | POA: Diagnosis not present

## 2022-10-21 ENCOUNTER — Telehealth: Payer: Federal, State, Local not specified - PPO | Admitting: Family Medicine

## 2022-10-21 ENCOUNTER — Encounter: Payer: Self-pay | Admitting: Family Medicine

## 2022-10-21 DIAGNOSIS — Z789 Other specified health status: Secondary | ICD-10-CM | POA: Diagnosis not present

## 2022-10-21 MED ORDER — DROSPIRENONE-ETHINYL ESTRADIOL 3-0.03 MG PO TABS: 1.0000 | ORAL_TABLET | Freq: Every day | ORAL | 4 refills | Status: DC

## 2022-10-21 NOTE — Progress Notes (Signed)
VIRTUAL VISIT VIA VIDEO  I connected with Janet Braun on 10/21/22 at  9:40 AM EDT by a video enabled telemedicine application and verified that I am speaking with the correct person using two identifiers. Location patient: Home Location provider: [facility], Office Persons participating in the virtual visit: Patient, Dr. Claiborne Billings and Ivonne Andrew, CMA  I discussed the limitations of evaluation and management by telemedicine and the availability of in person appointments. The patient expressed understanding and agreed to proceed.       Patient ID: Janet Braun, female  DOB: 17-Jun-1980, 42 y.o.   MRN: [id number] Patient Care Team    Relationship Specialty Notifications Start End  Natalia Leatherwood, DO PCP - General Family Medicine  09/03/21     Chief Complaint  Patient presents with   Contraception    Subjective:  Janet Braun is a 42 y.o.  female present for birth control maintenance and mammogram results.  Mammogram: completed:5/ 07/2022 IMPRESSION: 1. Likely benign 0.6 cm mass involving the LOWER OUTER QUADRANT of the LEFT breast at 4 o'clock 3 cm from the nipple. 2. No mammographic evidence of malignancy involving the RIGHT breast. 3. The previously identified focal asymmetry in the UPPER OUTER QUADRANT of the LEFT breast corresponds to overlapping normal fibroglandular tissue.   RECOMMENDATION: LEFT breast ultrasound in 6 months.  Patient's last menstrual period was 10/15/2022.  Menstrual cycles are approximately 30 days in length, regular, last about 4 to 6 days and not heavy.  She is compliant with Syeda BCP and would like to continue.  Cervical cancer screening up-to-date 12/06/2021, repeat in 3-5 years.  Results were normal screening with negative HPV.       10/21/2022    9:32 AM 12/04/2021    2:38 PM 09/03/2021    3:08 PM  Depression screen PHQ 2/9  Decreased Interest 0 0 0  Down, Depressed, Hopeless 0 0 0  PHQ - 2 Score 0 0 0  Altered  sleeping  1   Tired, decreased energy  2   Change in appetite  0   Feeling bad or failure about yourself   3   Trouble concentrating  2   Moving slowly or fidgety/restless  0   Suicidal thoughts  0   PHQ-9 Score  8       12/04/2021    2:39 PM  GAD 7 : Generalized Anxiety Score  Nervous, Anxious, on Edge 3  Control/stop worrying 2  Worry too much - different things 1  Trouble relaxing 3  Restless 0  Easily annoyed or irritable 2  Afraid - awful might happen 2  Total GAD 7 Score 13          10/21/2022    9:32 AM  Fall Risk   Falls in the past year? 0  Number falls in past yr: 0  Injury with Fall? 0  Risk for fall due to : No Fall Risks  Follow up Falls evaluation completed    Immunization History  Administered Date(s) Administered   Hep A / Hep B 07/02/1991, 08/25/1991, 02/25/1992   IPV 07/29/2006   Influenza Split 02/22/2013, 01/24/2015, 02/07/2016   Influenza, Seasonal, Injecte, Preservative Fre 01/22/2012, 02/09/2014   Influenza,inj,Quad PF,6+ Mos 02/19/2018   Influenza-Unspecified 02/17/2021   MMR 11/05/1982, 01/15/1989, 03/12/2012   Meningococcal Conjugate 03/12/2012   Moderna Covid-19 Vaccine Bivalent Booster 94yrs & up 03/20/2021   Moderna SARS-COV2 Booster Vaccination 02/04/2020   Moderna Sars-Covid-2 Vaccination  05/21/2019, 06/18/2019   PPD Test 04/27/2012   Td 05/22/1996   Td (Adult) 05/22/1996   Tdap 09/25/2005, 05/28/2016   Varicella 04/19/1988    No results found.  Past Medical History:  Diagnosis Date   Chicken pox    Encounter for cosmetic surgery 09/03/2021   Jaundice    at birth (pre-term)   Myopia 09/03/2021   Urinary incontinence    No Known Allergies Past Surgical History:  Procedure Laterality Date   NO PAST SURGERIES     Family History  Problem Relation Age of Onset   Asthma Mother    Thyroid cancer Mother    Hypertension Father    Hyperlipidemia Father    Asthma Brother    Heart disease Maternal Grandmother    Hypertension  Maternal Grandmother    Hyperlipidemia Maternal Grandmother    Heart attack Maternal Grandmother    Heart disease Maternal Grandfather    Hypertension Maternal Grandfather    Hyperlipidemia Maternal Grandfather    Lung cancer Maternal Grandfather        smoker   Prostate cancer Maternal Grandfather    Heart attack Paternal Grandmother    Hypertension Paternal Grandmother    Hyperlipidemia Paternal Grandmother    Heart disease Paternal Grandmother    Uterine cancer Paternal Grandmother    Heart disease Paternal Grandfather    Hypertension Paternal Grandfather    Hyperlipidemia Paternal Grandfather    Kidney disease Paternal Grandfather    Healthy Daughter    Breast cancer Maternal Aunt    Social History   Social History Narrative   Marital status/children/pets: Single/Divorced,G2P1   Education/employment: MD, employed Radiographer, therapeutic:      -Wears a bicycle helmet riding a bike: Yes     -smoke alarm in the home:Yes     - wears seatbelt: Yes     - Feels safe in their relationships: Yes       Allergies as of 10/21/2022   No Known Allergies      Medication List        Accurate as of October 21, 2022  9:50 AM. If you have any questions, ask your nurse or doctor.          STOP taking these medications    escitalopram 10 MG tablet Commonly known as: Lexapro Stopped by: Felix Pacini, DO       TAKE these medications    drospirenone-ethinyl estradiol 3-0.03 MG tablet Commonly known as: Syeda Take 1 tablet by mouth daily.   hydrOXYzine 10 MG tablet Commonly known as: ATARAX Take 0.5-2 tablets (5-20 mg total) by mouth 2 (two) times daily as needed.   tretinoin 0.025 % cream Commonly known as: RETIN-A Apply topically.   valACYclovir 1000 MG tablet Commonly known as: VALTREX Take 2 tablets (2,000 mg total) by mouth 2 (two) times daily as needed. Cold sore prn        All past medical history, surgical history, allergies, family history, immunizations  andmedications were updated in the EMR today and reviewed under the history and medication portions of their EMR.    No results found for this or any previous visit (from the past 2160 hour(s)).  MM DIAG BREAST TOMO UNI LEFT  Result Date: 09/22/2021 CLINICAL DATA:  Patient returns today to further characterize a possible asymmetry identified on recent baseline screening mammogram. EXAM: DIGITAL DIAGNOSTIC UNILATERAL LEFT MAMMOGRAM WITH TOMOSYNTHESIS AND CAD; ULTRASOUND LEFT BREAST LIMITED TECHNIQUE: Left digital IMPRESSION: Probably benign island of normal dense fibroglandular tissue  within the outer LEFT breast, with a normal ultrasound evaluation of the outer LEFT breast. Recommend follow-up LEFT breast diagnostic mammogram in 6 months to ensure stability. RECOMMENDATION: LEFT breast diagnostic mammogram in 6 months. I have discussed the findings and recommendations with the patient. If applicable, a reminder letter will be sent to the patient regarding the next appointment. BI-RADS CATEGORY  3: Probably benign. Electronically Signed   By: Bary Richard M.D.   On: 09/22/2021 08:01  US BREAST LTD UNI LEFT INC AXILLA  IMPRESSION: Probably benign island of normal dense fibroglandular tissue within the outer LEFT breast, with a normal ultrasound evaluation of the outer LEFT breast. Recommend follow-up LEFT breast diagnostic mammogram in 6 months to ensure stability. RECOMMENDATION: LEFT breast diagnostic mammogram in 6 months. I have discussed the findings and recommendations with the patient. If applicable, a reminder letter will be sent to the patient regarding the next appointment. BI-RADS CATEGORY  3: Probably benign. Electronically Signed   By: Bary Richard M.D.   On: 09/22/2021 08:01    Review of Systems  Genitourinary:  Negative for dysuria, frequency, hematuria and urgency.   14 pt review of systems performed and negative (unless mentioned in an HPI)  Objective: LMP 10/15/2022  Physical  Exam Vitals and nursing note reviewed.  Constitutional:      General: She is not in acute distress.    Appearance: Normal appearance. She is not toxic-appearing.  HENT:     Head: Normocephalic and atraumatic.  Eyes:     General: No scleral icterus.       Right eye: No discharge.        Left eye: No discharge.     Conjunctiva/sclera: Conjunctivae normal.  Pulmonary:     Effort: Pulmonary effort is normal.  Musculoskeletal:     Cervical back: Normal range of motion.  Skin:    Findings: No rash.  Neurological:     Mental Status: She is alert and oriented to person, place, and time. Mental status is at baseline.  Psychiatric:        Mood and Affect: Mood normal.        Behavior: Behavior normal.        Thought Content: Thought content normal.        Judgment: Judgment normal.     Assessment/plan: Janet Braun is a 42 y.o. female present for  Uses oral contraceptives as primary birth control method Stable Continue syeda qd- refills provided today.  Mammogram is with  "probably benign" findings of left breast, 6 mos follow up recommended to be extra cautious.  F/u in July for CPE and fasting labs     No follow-ups on file.  No orders of the defined types were placed in this encounter.  Meds ordered this encounter  Medications   drospirenone-ethinyl estradiol (SYEDA) 3-0.03 MG tablet    Sig: Take 1 tablet by mouth daily.    Dispense:  90 tablet    Refill:  4   Referral Orders  No referral(s) requested today     Note is dictated utilizing voice recognition software. Although note has been proof read prior to signing, occasional typographical errors still can be missed. If any questions arise, please do not hesitate to call for verification.  Electronically signed by: Felix Pacini, DO [facility] [place]

## 2022-10-21 NOTE — Patient Instructions (Addendum)
Return in about 7 weeks (around 12/12/2022) for cpe (20 min).        Great to see you today.  I have refilled the medication(s) we provide.   If labs were collected, we will inform you of lab results once received either by echart message or telephone call.   - echart message- for normal results that have been seen by the patient already.   - telephone call: abnormal results or if patient has not viewed results in their echart.

## 2022-11-01 DIAGNOSIS — F4323 Adjustment disorder with mixed anxiety and depressed mood: Secondary | ICD-10-CM | POA: Diagnosis not present

## 2022-11-25 ENCOUNTER — Encounter: Payer: Self-pay | Admitting: Family Medicine

## 2022-11-26 ENCOUNTER — Telehealth: Payer: Self-pay

## 2022-11-26 NOTE — Telephone Encounter (Signed)
Signed and returned to CMA work basket 

## 2022-11-26 NOTE — Telephone Encounter (Signed)
Faxed

## 2022-11-26 NOTE — Telephone Encounter (Signed)
Received Mammogram orders from [facility] imaging on 11/26/22. Will place on PCP desk for signature.

## 2022-12-18 ENCOUNTER — Encounter (INDEPENDENT_AMBULATORY_CARE_PROVIDER_SITE_OTHER): Payer: Self-pay

## 2022-12-19 ENCOUNTER — Encounter: Payer: Federal, State, Local not specified - PPO | Admitting: Family Medicine

## 2023-01-14 ENCOUNTER — Ambulatory Visit (INDEPENDENT_AMBULATORY_CARE_PROVIDER_SITE_OTHER): Payer: Federal, State, Local not specified - PPO | Admitting: Family Medicine

## 2023-01-14 ENCOUNTER — Encounter: Payer: Self-pay | Admitting: Family Medicine

## 2023-01-14 VITALS — BP 117/80 | HR 78 | Temp 97.8°F | Ht 62.0 in | Wt 145.8 lb

## 2023-01-14 DIAGNOSIS — Z13220 Encounter for screening for lipoid disorders: Secondary | ICD-10-CM | POA: Diagnosis not present

## 2023-01-14 DIAGNOSIS — Z1231 Encounter for screening mammogram for malignant neoplasm of breast: Secondary | ICD-10-CM

## 2023-01-14 DIAGNOSIS — B001 Herpesviral vesicular dermatitis: Secondary | ICD-10-CM | POA: Diagnosis not present

## 2023-01-14 DIAGNOSIS — Z0000 Encounter for general adult medical examination without abnormal findings: Secondary | ICD-10-CM | POA: Diagnosis not present

## 2023-01-14 DIAGNOSIS — Z131 Encounter for screening for diabetes mellitus: Secondary | ICD-10-CM

## 2023-01-14 DIAGNOSIS — Z23 Encounter for immunization: Secondary | ICD-10-CM | POA: Diagnosis not present

## 2023-01-14 DIAGNOSIS — L708 Other acne: Secondary | ICD-10-CM

## 2023-01-14 LAB — LIPID PANEL
Cholesterol: 217 mg/dL — ABNORMAL HIGH (ref 0–200)
HDL: 104.3 mg/dL (ref >39.00)
LDL Cholesterol: 95 mg/dL (ref 0–99)
NonHDL: 112.76
Total CHOL/HDL Ratio: 2
Triglycerides: 88 mg/dL (ref 0.0–149.0)
VLDL: 17.6 mg/dL (ref 0.0–40.0)

## 2023-01-14 LAB — COMPREHENSIVE METABOLIC PANEL
ALT: 21 U/L (ref 0–35)
AST: 19 U/L (ref 0–37)
Albumin: 4.4 g/dL (ref 3.5–5.2)
Alkaline Phosphatase: 51 U/L (ref 39–117)
BUN: 19 mg/dL (ref 6–23)
CO2: 27 mEq/L (ref 19–32)
Calcium: 10.2 mg/dL (ref 8.4–10.5)
Chloride: 101 mEq/L (ref 96–112)
Creatinine, Ser: 0.85 mg/dL (ref 0.40–1.20)
GFR: 85.05 mL/min (ref >60.00)
Glucose, Bld: 89 mg/dL (ref 70–99)
Potassium: 5 mEq/L (ref 3.5–5.1)
Sodium: 138 mEq/L (ref 135–145)
Total Bilirubin: 0.5 mg/dL (ref 0.2–1.2)
Total Protein: 7.7 g/dL (ref 6.0–8.3)

## 2023-01-14 LAB — CBC WITH DIFFERENTIAL/PLATELET
Basophils Absolute: 0 10*3/uL (ref 0.0–0.1)
Basophils Relative: 0.6 % (ref 0.0–3.0)
Eosinophils Absolute: 0 10*3/uL (ref 0.0–0.7)
Eosinophils Relative: 0.5 % (ref 0.0–5.0)
HCT: 45.2 % (ref 36.0–46.0)
Hemoglobin: 14.7 g/dL (ref 12.0–15.0)
Lymphocytes Relative: 38 % (ref 12.0–46.0)
Lymphs Abs: 2.3 10*3/uL (ref 0.7–4.0)
MCHC: 32.5 g/dL (ref 30.0–36.0)
MCV: 95.3 fl (ref 78.0–100.0)
Monocytes Absolute: 0.5 10*3/uL (ref 0.1–1.0)
Monocytes Relative: 8.5 % (ref 3.0–12.0)
Neutro Abs: 3.2 10*3/uL (ref 1.4–7.7)
Neutrophils Relative %: 52.4 % (ref 43.0–77.0)
Platelets: 356 10*3/uL (ref 150.0–400.0)
RBC: 4.74 Mil/uL (ref 3.87–5.11)
RDW: 13 % (ref 11.5–15.5)
WBC: 6.2 10*3/uL (ref 4.0–10.5)

## 2023-01-14 LAB — TSH: TSH: 0.66 u[IU]/mL (ref 0.35–5.50)

## 2023-01-14 LAB — HEMOGLOBIN A1C: Hgb A1c MFr Bld: 5.4 % (ref 4.6–6.5)

## 2023-01-14 MED ORDER — DROSPIRENONE-ETHINYL ESTRADIOL 3-0.03 MG PO TABS: 1.0000 | ORAL_TABLET | Freq: Every day | ORAL | 4 refills | Status: DC

## 2023-01-14 MED ORDER — SPIRONOLACTONE 25 MG PO TABS
25.0000 mg | ORAL_TABLET | Freq: Every day | ORAL | 1 refills | Status: DC
Start: 2023-01-14 — End: 2023-03-11

## 2023-01-14 MED ORDER — VALACYCLOVIR HCL 1 G PO TABS: 2000.0000 mg | ORAL_TABLET | Freq: Two times a day (BID) | ORAL | 3 refills | Status: DC | PRN

## 2023-01-14 NOTE — Patient Instructions (Addendum)
Return in about 1 year (around 01/15/2024) for cpe (20 min), Routine chronic condition follow-up.        Great to see you today.  I have refilled the medication(s) we provide.   If labs were collected or images ordered, we will inform you of  results once we have received them and reviewed. We will contact you either by echart message, or telephone call.  Please give ample time to the testing facility, and our office to run,  receive and review results. Please do not call inquiring of results, even if you can see them in your chart. We will contact you as soon as we are able. If it has been over 1 week since the test was completed, and you have not yet heard from [place], then please call us.    - echart message- for normal results that have been seen by the patient already.   - telephone call: abnormal results or if patient has not viewed results in their echart.  If a referral to a specialist was entered for you, please call us in 2 weeks if you have not heard from the specialist office to schedule.

## 2023-01-14 NOTE — Progress Notes (Signed)
Patient ID: Janet Braun, female  DOB: 15-Aug-1980, 42 y.o.   MRN: [id number] Patient Care Team    Relationship Specialty Notifications Start End  Natalia Leatherwood, DO PCP - General Family Medicine  09/03/21     Chief Complaint  Patient presents with   Annual Exam    Fasting CPE. Wants to ask about spirolactone for acne.    Subjective: Janet Braun is a 42 y.o.  Female  present for CPE All past medical history, surgical history, allergies, family history, immunizations, medications and social history were updated in the electronic medical record today. All recent labs, ED visits and hospitalizations within the last year were reviewed.  Health maintenance:  Colonoscopy:No fhx  Routine screen at 46 Mammogram: fhx present in mat-aunt. Completed 09/20/2022 Ordered today for 2025 [facility] Cervical cancer screening:12/10/2021-WNL-neg hpv. 5 yr recall. Dr. Claiborne Billings Immunizations: tdap UTD 2018, Influenza declined (encouraged yearly), covid x3 completed Infectious disease screening: HIV and Hep C completed DEXA:routine screen Assistive device: none Oxygen DGU:YQIH Patient has a Dental home. Hospitalizations/ED visits: reviewed  Acne: Patient reports she has oily skin and acne she believes is secondary to her fluctuation in hormones.  She would like to try spironolactone low-dose to see if it is helpful.     01/14/2023    9:12 AM 10/21/2022    9:32 AM 12/04/2021    2:38 PM 09/03/2021    3:08 PM  Depression screen PHQ 2/9  Decreased Interest 0 0 0 0  Down, Depressed, Hopeless 0 0 0 0  PHQ - 2 Score 0 0 0 0  Altered sleeping 0  1   Tired, decreased energy 1  2   Change in appetite 0  0   Feeling bad or failure about yourself  1  3   Trouble concentrating 0  2   Moving slowly or fidgety/restless 0  0   Suicidal thoughts 0  0   PHQ-9 Score 2  8   Difficult doing work/chores Somewhat difficult         01/14/2023    9:13 AM 12/04/2021    2:39 PM  GAD 7 : Generalized Anxiety  Score  Nervous, Anxious, on Edge  3  Control/stop worrying 1 2  Worry too much - different things 0 1  Trouble relaxing 0 3  Restless 0 0  Easily annoyed or irritable 1 2  Afraid - awful might happen 1 2  Total GAD 7 Score  13  Anxiety Difficulty Somewhat difficult     Immunization History  Administered Date(s) Administered   Hep A / Hep B 07/02/1991, 08/25/1991, 02/25/1992   IPV 07/29/2006   Influenza Split 02/22/2013, 01/24/2015, 02/07/2016   Influenza, Seasonal, Injecte, Preservative Fre 01/22/2012, 02/09/2014   Influenza,inj,Quad PF,6+ Mos 02/19/2018   Influenza-Unspecified 02/17/2021   MMR 11/05/1982, 01/15/1989, 03/12/2012   Meningococcal Conjugate 03/12/2012   Moderna Covid-19 Vaccine Bivalent Booster 53yrs & up 03/20/2021   Moderna SARS-COV2 Booster Vaccination 02/04/2020   Moderna Sars-Covid-2 Vaccination 05/21/2019, 06/18/2019   PPD Test 04/27/2012   Td 05/22/1996   Td (Adult) 05/22/1996   Tdap 09/25/2005, 05/28/2016   Varicella 04/19/1988    Past Medical History:  Diagnosis Date   Chicken pox    Encounter for cosmetic surgery 09/03/2021   Jaundice    at birth (pre-term)   Myopia 09/03/2021   Urinary incontinence    No Known Allergies Past Surgical History:  Procedure Laterality Date   NO PAST SURGERIES  Family History  Problem Relation Age of Onset   Asthma Mother    Thyroid cancer Mother    Hypertension Father    Hyperlipidemia Father    Asthma Brother    Heart disease Maternal Grandmother    Hypertension Maternal Grandmother    Hyperlipidemia Maternal Grandmother    Heart attack Maternal Grandmother    Heart disease Maternal Grandfather    Hypertension Maternal Grandfather    Hyperlipidemia Maternal Grandfather    Lung cancer Maternal Grandfather        smoker   Prostate cancer Maternal Grandfather    Heart attack Paternal Grandmother    Hypertension Paternal Grandmother    Hyperlipidemia Paternal Grandmother    Heart disease Paternal  Grandmother    Uterine cancer Paternal Grandmother    Heart disease Paternal Grandfather    Hypertension Paternal Grandfather    Hyperlipidemia Paternal Grandfather    Kidney disease Paternal Grandfather    Healthy Daughter    Breast cancer Maternal Aunt    Social History   Social History Narrative   Marital status/children/pets: Single/Divorced,G2P1   Education/employment: MD, employed Radiographer, therapeutic:      -Wears a bicycle helmet riding a bike: Yes     -smoke alarm in the home:Yes     - wears seatbelt: Yes     - Feels safe in their relationships: Yes      Allergies as of 01/14/2023   No Known Allergies      Medication List        Accurate as of January 14, 2023  9:42 AM. If you have any questions, ask your nurse or doctor.          STOP taking these medications    hydrOXYzine 10 MG tablet Commonly known as: ATARAX Stopped by: Felix Pacini       TAKE these medications    Clindamycin Phos-Benzoyl Perox 1.2-3.75 % Gel Apply topically.   drospirenone-ethinyl estradiol 3-0.03 MG tablet Commonly known as: Syeda Take 1 tablet by mouth daily.   tretinoin 0.025 % cream Commonly known as: RETIN-A Apply topically.   valACYclovir 1000 MG tablet Commonly known as: VALTREX Take 2 tablets (2,000 mg total) by mouth 2 (two) times daily as needed. Cold sore prn        All past medical history, surgical history, allergies, family history, immunizations andmedications were updated in the EMR today and reviewed under the history and medication portions of their EMR.     No results found for this or any previous visit (from the past 2160 hour(s)).  Patient was never admitted.  ROS 14 pt review of systems performed and negative (unless mentioned in an HPI)  Objective: BP 117/80   Pulse 78   Temp 97.8 F (36.6 C) (Oral)   Ht 5\' 2"  (1.575 m)   Wt 145 lb 12.8 oz (66.1 kg)   SpO2 100%   BMI 26.67 kg/m  Physical Exam Vitals and nursing note reviewed.   Constitutional:      General: She is not in acute distress.    Appearance: Normal appearance. She is not ill-appearing or toxic-appearing.  HENT:     Head: Normocephalic and atraumatic.     Right Ear: Tympanic membrane, ear canal and external ear normal. There is no impacted cerumen.     Left Ear: Tympanic membrane, ear canal and external ear normal. There is no impacted cerumen.     Nose: No congestion or rhinorrhea.     Mouth/Throat:  Mouth: Mucous membranes are moist.     Pharynx: Oropharynx is clear. No oropharyngeal exudate or posterior oropharyngeal erythema.  Eyes:     General: No scleral icterus.       Right eye: No discharge.        Left eye: No discharge.     Extraocular Movements: Extraocular movements intact.     Conjunctiva/sclera: Conjunctivae normal.     Pupils: Pupils are equal, round, and reactive to light.  Cardiovascular:     Rate and Rhythm: Normal rate and regular rhythm.     Pulses: Normal pulses.     Heart sounds: Normal heart sounds. No murmur heard.    No friction rub. No gallop.  Pulmonary:     Effort: Pulmonary effort is normal. No respiratory distress.     Breath sounds: Normal breath sounds. No stridor. No wheezing, rhonchi or rales.  Chest:     Chest wall: No tenderness.  Abdominal:     General: Abdomen is flat. Bowel sounds are normal. There is no distension.     Palpations: Abdomen is soft. There is no mass.     Tenderness: There is no abdominal tenderness. There is no right CVA tenderness, left CVA tenderness, guarding or rebound.     Hernia: No hernia is present.  Musculoskeletal:        General: No swelling, tenderness or deformity. Normal range of motion.     Cervical back: Normal range of motion and neck supple. No rigidity or tenderness.     Right lower leg: No edema.     Left lower leg: No edema.  Lymphadenopathy:     Cervical: No cervical adenopathy.  Skin:    General: Skin is warm and dry.     Coloration: Skin is not jaundiced or  pale.     Findings: No bruising, erythema, lesion or rash.  Neurological:     General: No focal deficit present.     Mental Status: She is alert and oriented to person, place, and time. Mental status is at baseline.     Cranial Nerves: No cranial nerve deficit.     Sensory: No sensory deficit.     Motor: No weakness.     Coordination: Coordination normal.     Gait: Gait normal.     Deep Tendon Reflexes: Reflexes normal.  Psychiatric:        Mood and Affect: Mood normal.        Behavior: Behavior normal.        Thought Content: Thought content normal.        Judgment: Judgment normal.      No results found.  Assessment/plan: Janet Braun is a 42 y.o. 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 Depending upon today's potassium level, may need to follow-up shortly after starting medication for repeat BMP or if needing increase in spironolactone dose.  Encounter for preventive health examination Patient was encouraged to exercise greater than 150 minutes a week. Patient was encouraged to choose a diet filled with fresh fruits and vegetables, and lean meats. AVS provided to patient today for education/recommendation on gender specific health and safety maintenance. Colonoscopy:No fhx  Routine screen at 46 Mammogram: fhx present in mat-aunt. Completed 09/20/2022 Ordered today for  2025 [facility] Cervical cancer screening:12/10/2021-WNL-neg hpv. 5 yr recall. Dr. Claiborne Billings Immunizations: tdap UTD 2018, Influenza declined (encouraged yearly), covid x3 completed Infectious disease screening: HIV and Hep C completed DEXA:routine screen - CBC with Differential/Platelet - Comprehensive metabolic panel -  TSH  Return in about 1 year (around 01/15/2024) for cpe (20 min), Routine chronic condition follow-up.   Orders Placed This Encounter  Procedures   MM 3D SCREENING MAMMOGRAM BILATERAL BREAST   CBC with Differential/Platelet   Comprehensive metabolic panel   Hemoglobin A1c   TSH   Lipid panel   Meds ordered this encounter  Medications   valACYclovir (VALTREX) 1000 MG tablet    Sig: Take 2 tablets (2,000 mg total) by mouth 2 (two) times daily as needed. Cold sore prn    Dispense:  20 tablet    Refill:  3   drospirenone-ethinyl estradiol (SYEDA) 3-0.03 MG tablet    Sig: Take 1 tablet by mouth daily.    Dispense:  90 tablet    Refill:  4   Referral Orders  No referral(s) requested today     Electronically signed by: Felix Pacini, DO [facility] [place]

## 2023-01-17 NOTE — Addendum Note (Signed)
Addended by: Filomena Jungling on: 01/17/2023 08:53 AM   Modules accepted: Orders

## 2023-02-07 ENCOUNTER — Encounter: Payer: Self-pay | Admitting: Family Medicine

## 2023-02-07 DIAGNOSIS — E875 Hyperkalemia: Secondary | ICD-10-CM

## 2023-02-10 NOTE — Addendum Note (Signed)
Addended by: Filomena Jungling on: 02/10/2023 03:38 PM   Modules accepted: Orders

## 2023-02-10 NOTE — Telephone Encounter (Signed)
May potassium rechecked. Please place lab appt only order for bmp for her

## 2023-02-21 ENCOUNTER — Other Ambulatory Visit (INDEPENDENT_AMBULATORY_CARE_PROVIDER_SITE_OTHER): Payer: Federal, State, Local not specified - PPO

## 2023-02-21 DIAGNOSIS — E875 Hyperkalemia: Secondary | ICD-10-CM | POA: Diagnosis not present

## 2023-02-21 LAB — BASIC METABOLIC PANEL
BUN: 14 mg/dL (ref 6–23)
CO2: 25 meq/L (ref 19–32)
Calcium: 9.8 mg/dL (ref 8.4–10.5)
Chloride: 103 meq/L (ref 96–112)
Creatinine, Ser: 0.89 mg/dL (ref 0.40–1.20)
GFR: 80.43 mL/min (ref >60.00)
Glucose, Bld: 106 mg/dL — ABNORMAL HIGH (ref 70–99)
Potassium: 4.8 meq/L (ref 3.5–5.1)
Sodium: 138 meq/L (ref 135–145)

## 2023-02-27 DIAGNOSIS — D229 Melanocytic nevi, unspecified: Secondary | ICD-10-CM | POA: Diagnosis not present

## 2023-02-27 DIAGNOSIS — L700 Acne vulgaris: Secondary | ICD-10-CM | POA: Diagnosis not present

## 2023-02-27 DIAGNOSIS — L814 Other melanin hyperpigmentation: Secondary | ICD-10-CM | POA: Diagnosis not present

## 2023-02-27 DIAGNOSIS — L578 Other skin changes due to chronic exposure to nonionizing radiation: Secondary | ICD-10-CM | POA: Diagnosis not present

## 2023-03-11 MED ORDER — SPIRONOLACTONE 50 MG PO TABS: 50.0000 mg | ORAL_TABLET | Freq: Every day | ORAL | 1 refills | Status: DC

## 2023-03-11 NOTE — Addendum Note (Signed)
Addended by: Felix Pacini A on: 03/11/2023 11:31 AM   Modules accepted: Orders

## 2023-03-11 NOTE — Telephone Encounter (Signed)
Increase spirolactone to 50 mg daily.  Prescription called in. Follow up in 5.5 mos.

## 2023-03-25 ENCOUNTER — Other Ambulatory Visit: Payer: Self-pay | Admitting: Family Medicine

## 2023-03-25 ENCOUNTER — Ambulatory Visit
Admission: RE | Admit: 2023-03-25 | Discharge: 2023-03-25 | Disposition: A | Discharge status: Home / Self Care | Payer: Federal, State, Local not specified - PPO | Source: Ambulatory Visit | Attending: Family Medicine | Admitting: Family Medicine

## 2023-03-25 DIAGNOSIS — N6323 Unspecified lump in the left breast, lower outer quadrant: Secondary | ICD-10-CM | POA: Diagnosis not present

## 2023-03-25 DIAGNOSIS — N6320 Unspecified lump in the left breast, unspecified quadrant: Secondary | ICD-10-CM

## 2023-07-12 IMAGING — MG MM DIGITAL DIAGNOSTIC UNILAT*L* W/ TOMO W/ CAD
6 series · 6 of 18 positions shown · non-contrast
Comparison: Previous exam(s).

CLINICAL DATA: Patient returns today to further characterize a
possible asymmetry identified on recent baseline screening
mammogram.

EXAM:
DIGITAL DIAGNOSTIC UNILATERAL LEFT MAMMOGRAM WITH TOMOSYNTHESIS AND
CAD; ULTRASOUND LEFT BREAST LIMITED
TECHNIQUE: Left digital diagnostic mammography and breast tomosynthesis was
performed. The images were evaluated with computer-aided detection.;
Targeted ultrasound examination of the left breast was performed.

[L MLO synth-2D (1 of 2)]
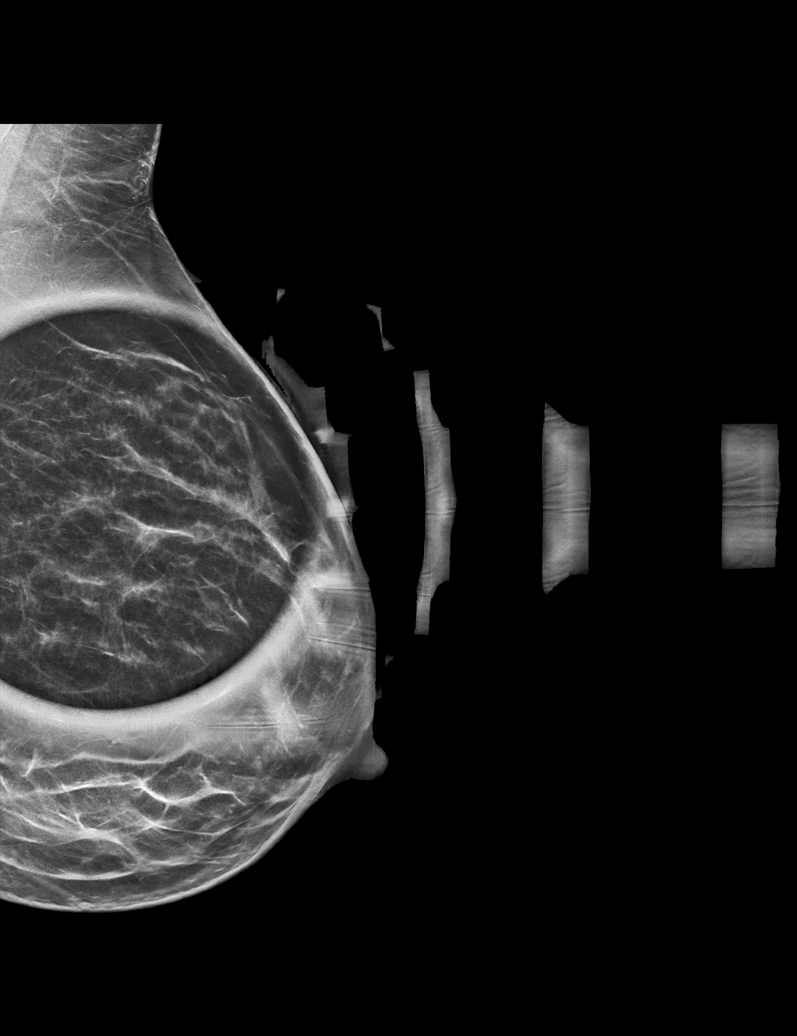

[L CC synth-2D]
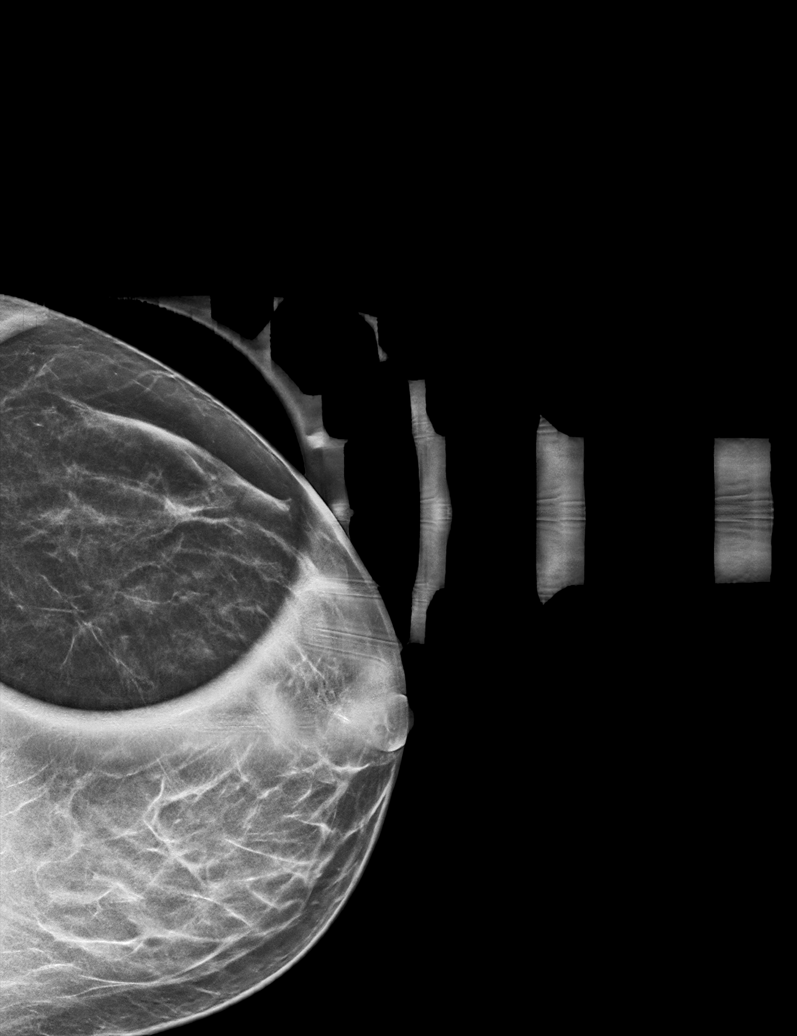

[L MLO synth-2D (2 of 2)]
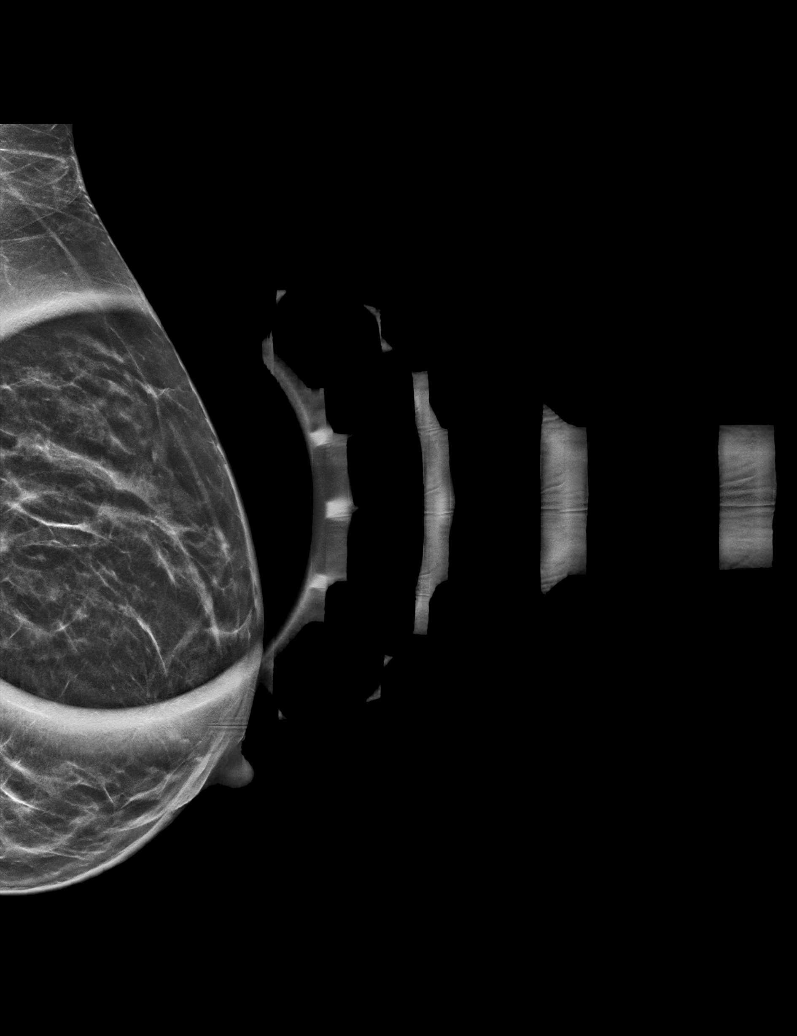

[L MLO tomo (1 of 2) · tomo slice 25/48.0]
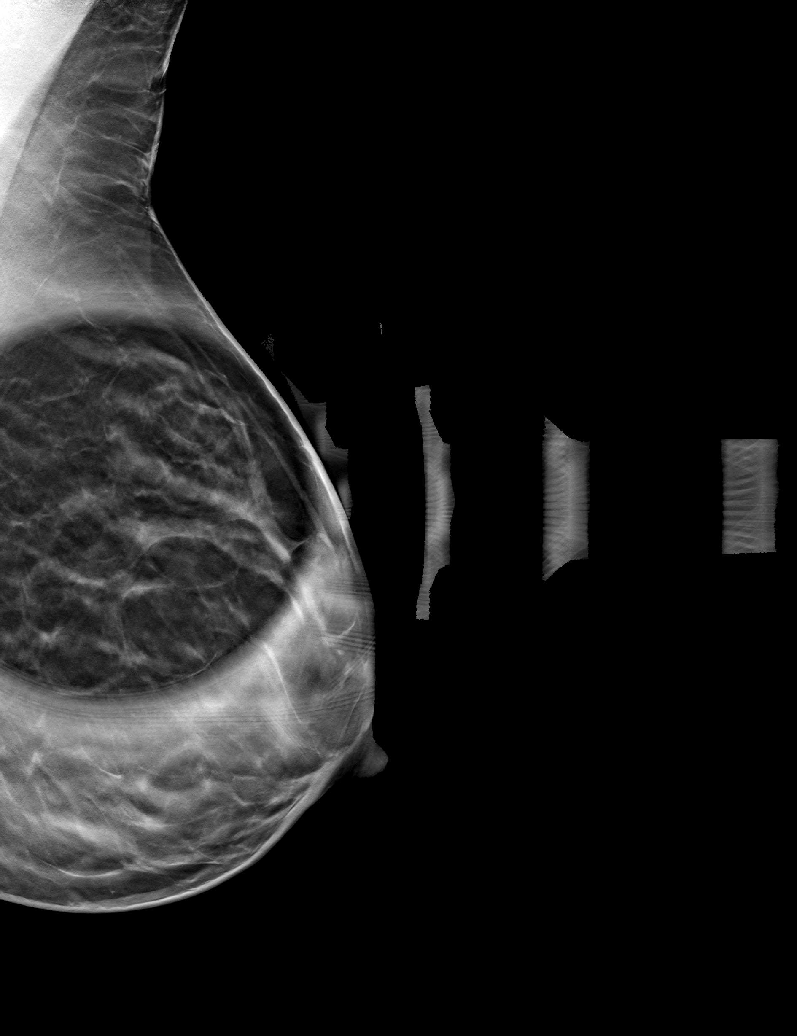

[L MLO tomo (2 of 2) · tomo slice 23/45.0]
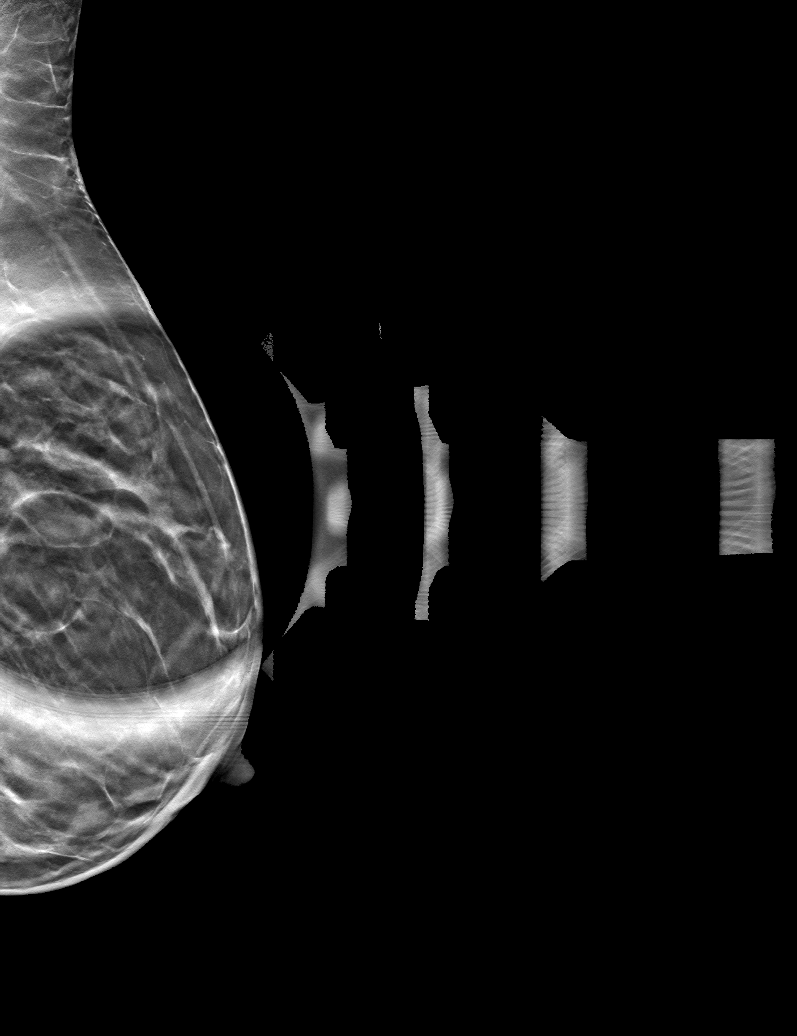

[L CC tomo · tomo slice 23/44.0]
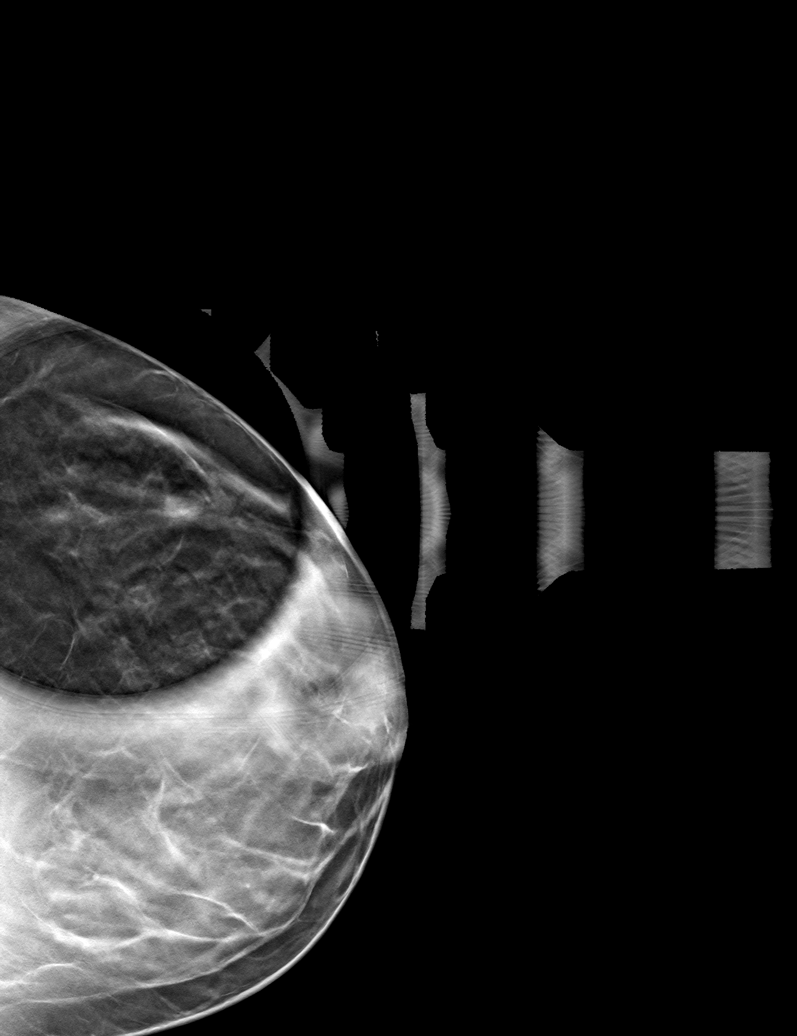

[6 of 18 positions shown; findings below may reference images not displayed]

ACR Breast Density Category b: There are scattered areas of
fibroglandular density.
FINDINGS: On today's additional diagnostic views, the questioned asymmetry
within the outer LEFT breast is most suggestive of an island of
normal dense fibroglandular tissue. Ultrasound will be performed to
ensure benignity.

Targeted ultrasound is performed, evaluating the entire outer LEFT
breast, showing only normal fibroglandular tissues and fat lobules
throughout. No solid or cystic mass.
IMPRESSION: Probably benign island of normal dense fibroglandular tissue within
the outer LEFT breast, with a normal ultrasound evaluation of the
outer LEFT breast. Recommend follow-up LEFT breast diagnostic
mammogram in 6 months to ensure stability.

RECOMMENDATION:
LEFT breast diagnostic mammogram in 6 months.

I have discussed the findings and recommendations with the patient.
If applicable, a reminder letter will be sent to the patient
regarding the next appointment.

BI-RADS CATEGORY  3: Probably benign.

## 2023-08-19 ENCOUNTER — Other Ambulatory Visit: Payer: Self-pay | Admitting: Family Medicine

## 2023-08-19 MED ORDER — SPIRONOLACTONE 50 MG PO TABS: 50.0000 mg | ORAL_TABLET | Freq: Every day | ORAL | 1 refills | Status: DC

## 2023-08-19 NOTE — Telephone Encounter (Signed)
 Copied from CRM [id number]. Topic: Clinical - Medication Refill >> Aug 19, 2023  2:41 PM Denese Killings wrote: Most Recent Primary Care Visit:  Provider: [facility]  Department: [facility]  Visit Type: LAB  Date: 02/21/2023  Medication: spironolactone (ALDACTONE) 50 MG tablet **wants to increase dosage 75 or 100  Has the patient contacted their pharmacy? No (Agent: If no, request that the patient contact the pharmacy for the refill. If patient does not wish to contact the pharmacy document the reason why and proceed with request.) (Agent: If yes, when and what did the pharmacy advise?) wants to increase dosage  Is this the correct pharmacy for this prescription? Yes If no, delete pharmacy and type the correct one.  This is the patient's preferred pharmacy:  [id number] - [place], [place] - [address] [place] [address] Phone: [phone] Fax: [phone]   Has the prescription been filled recently? No  Is the patient out of the medication? No 1 week left   Has the patient been seen for an appointment in the last year OR does the patient have an upcoming appointment? Yes  Can we respond through MyChart? Yes  Agent: Please be advised that Rx refills may take up to 3 business days. We ask that you follow-up with your pharmacy.

## 2023-08-19 NOTE — Telephone Encounter (Signed)
 Last Fill: 03/11/23 90 tabs/1 RF  Last OV: 01/14/23 Next OV: 01/16/24  Routing to provider for review/authorization.

## 2023-09-26 ENCOUNTER — Ambulatory Visit
Admission: RE | Admit: 2023-09-26 | Discharge: 2023-09-26 | Disposition: A | Discharge status: Home / Self Care | Payer: Federal, State, Local not specified - PPO | Source: Ambulatory Visit | Attending: Family Medicine | Admitting: Family Medicine

## 2023-09-26 DIAGNOSIS — N6320 Unspecified lump in the left breast, unspecified quadrant: Secondary | ICD-10-CM

## 2023-09-26 DIAGNOSIS — N6323 Unspecified lump in the left breast, lower outer quadrant: Secondary | ICD-10-CM | POA: Diagnosis not present

## 2023-12-26 DIAGNOSIS — L309 Dermatitis, unspecified: Secondary | ICD-10-CM | POA: Diagnosis not present

## 2024-01-15 ENCOUNTER — Encounter: Payer: Federal, State, Local not specified - PPO | Admitting: Family Medicine

## 2024-01-16 ENCOUNTER — Encounter: Payer: Self-pay | Admitting: Family Medicine

## 2024-01-16 ENCOUNTER — Ambulatory Visit (INDEPENDENT_AMBULATORY_CARE_PROVIDER_SITE_OTHER): Admitting: Family Medicine

## 2024-01-16 VITALS — BP 118/80 | HR 82 | Temp 98.1°F | Ht 62.0 in | Wt 150.2 lb

## 2024-01-16 DIAGNOSIS — Z131 Encounter for screening for diabetes mellitus: Secondary | ICD-10-CM | POA: Diagnosis not present

## 2024-01-16 DIAGNOSIS — Z13220 Encounter for screening for lipoid disorders: Secondary | ICD-10-CM | POA: Diagnosis not present

## 2024-01-16 DIAGNOSIS — Z789 Other specified health status: Secondary | ICD-10-CM | POA: Diagnosis not present

## 2024-01-16 DIAGNOSIS — N6320 Unspecified lump in the left breast, unspecified quadrant: Secondary | ICD-10-CM

## 2024-01-16 DIAGNOSIS — Z0000 Encounter for general adult medical examination without abnormal findings: Secondary | ICD-10-CM

## 2024-01-16 DIAGNOSIS — Z8249 Family history of ischemic heart disease and other diseases of the circulatory system: Secondary | ICD-10-CM

## 2024-01-16 DIAGNOSIS — Z793 Long term (current) use of hormonal contraceptives: Secondary | ICD-10-CM

## 2024-01-16 DIAGNOSIS — Z1231 Encounter for screening mammogram for malignant neoplasm of breast: Secondary | ICD-10-CM

## 2024-01-16 DIAGNOSIS — L708 Other acne: Secondary | ICD-10-CM

## 2024-01-16 DIAGNOSIS — B001 Herpesviral vesicular dermatitis: Secondary | ICD-10-CM | POA: Diagnosis not present

## 2024-01-16 LAB — COMPREHENSIVE METABOLIC PANEL WITH GFR
ALT: 17 U/L (ref 0–35)
AST: 17 U/L (ref 0–37)
Albumin: 4.4 g/dL (ref 3.5–5.2)
Alkaline Phosphatase: 42 U/L (ref 39–117)
BUN: 13 mg/dL (ref 6–23)
CO2: 26 meq/L (ref 19–32)
Calcium: 9.5 mg/dL (ref 8.4–10.5)
Chloride: 102 meq/L (ref 96–112)
Creatinine, Ser: 0.83 mg/dL (ref 0.40–1.20)
GFR: 86.9 mL/min (ref >60.00)
Glucose, Bld: 84 mg/dL (ref 70–99)
Potassium: 4.5 meq/L (ref 3.5–5.1)
Sodium: 138 meq/L (ref 135–145)
Total Bilirubin: 0.5 mg/dL (ref 0.2–1.2)
Total Protein: 7.4 g/dL (ref 6.0–8.3)

## 2024-01-16 LAB — CBC
HCT: 43.2 % (ref 36.0–46.0)
Hemoglobin: 14.1 g/dL (ref 12.0–15.0)
MCHC: 32.7 g/dL (ref 30.0–36.0)
MCV: 96.2 fl (ref 78.0–100.0)
Platelets: 265 K/uL (ref 150.0–400.0)
RBC: 4.49 Mil/uL (ref 3.87–5.11)
RDW: 13.1 % (ref 11.5–15.5)
WBC: 4.9 K/uL (ref 4.0–10.5)

## 2024-01-16 LAB — LIPID PANEL
Cholesterol: 180 mg/dL (ref 0–200)
HDL: 106.6 mg/dL (ref >39.00)
LDL Cholesterol: 62 mg/dL (ref 0–99)
NonHDL: 73.23
Total CHOL/HDL Ratio: 2
Triglycerides: 57 mg/dL (ref 0.0–149.0)
VLDL: 11.4 mg/dL (ref 0.0–40.0)

## 2024-01-16 LAB — TSH: TSH: 1.39 u[IU]/mL (ref 0.35–5.50)

## 2024-01-16 LAB — HEMOGLOBIN A1C: Hgb A1c MFr Bld: 5.5 % (ref 4.6–6.5)

## 2024-01-16 MED ORDER — DROSPIRENONE-ETHINYL ESTRADIOL 3-0.03 MG PO TABS: 1.0000 | ORAL_TABLET | Freq: Every day | ORAL | 4 refills | Status: AC

## 2024-01-16 MED ORDER — CLOTRIMAZOLE 1 % EX CREA: 1.0000 | TOPICAL_CREAM | Freq: Two times a day (BID) | CUTANEOUS | 5 refills | Status: AC

## 2024-01-16 MED ORDER — VALACYCLOVIR HCL 1 G PO TABS: 2000.0000 mg | ORAL_TABLET | Freq: Two times a day (BID) | ORAL | 3 refills | Status: AC | PRN

## 2024-01-16 NOTE — Patient Instructions (Addendum)

## 2024-01-16 NOTE — Progress Notes (Signed)
 Patient ID: Janet Braun, female  DOB: 30-Apr-1981, 43 y.o.   MRN: [id number] Patient Care Team    Relationship Specialty Notifications Start End  Catherine Charlies LABOR, DO PCP - General Family Medicine  09/03/21     Chief Complaint  Patient presents with   Annual Exam    Chronic Conditions/illness Management.  Pt is fasting.      Subjective: Janet Braun is a 43 y.o.  Female  present for CPE All past medical history, surgical history, allergies, family history, immunizations, medications and social history were updated in the electronic medical record today. All recent labs, ED visits and hospitalizations within the last year were reviewed.  Health maintenance:  Colon cancer:No fhx  Routine screen at 46 Mammogram: fhx present in [facility]. Completed 09/26/2023>ordered second diagnostic with left breast ultrasound today for 2026 [facility] Cervical cancer screening:12/10/2021-WNL-neg hpv. 5 yr recall. Dr. Catherine Immunizations: tdap UTD 2018, Influenza  (encouraged yearly) Infectious disease screening: HIV and Hep C completed DEXA:routine screen to start at age 44-65 Assistive device: none Oxygen ldz:wnwz Patient has a Dental home. Hospitalizations/ED visits: Reviewed  Acne: Patient reports she has oily skin and acne she believes is secondary to her fluctuation in hormones.   She was started on spironolactone  and tapered up to spironolactone  50 mg daily.  Today she reports she feels the spiro is working well. Would like to increase dose if possible.      01/16/2024    8:03 AM 01/14/2023    9:12 AM 10/21/2022    9:32 AM 12/04/2021    2:38 PM 09/03/2021    3:08 PM  Depression screen PHQ 2/9  Decreased Interest 0 0 0 0 0  Down, Depressed, Hopeless 0 0 0 0 0  PHQ - 2 Score 0 0 0 0 0  Altered sleeping 1 0  1   Tired, decreased energy 2 1  2    Change in appetite 1 0  0   Feeling bad or failure about yourself  1 1  3    Trouble concentrating 0 0  2   Moving slowly or  fidgety/restless 0 0  0   Suicidal thoughts 0 0  0   PHQ-9 Score 5 2  8    Difficult doing work/chores Somewhat difficult Somewhat difficult         01/16/2024    8:03 AM 01/14/2023    9:13 AM 12/04/2021    2:39 PM  GAD 7 : Generalized Anxiety Score  Nervous, Anxious, on Edge 0  3  Control/stop worrying 0 1 2  Worry too much - different things 1 0 1  Trouble relaxing 3 0 3  Restless 0 0 0  Easily annoyed or irritable 1 1 2   Afraid - awful might happen 1 1 2   Total GAD 7 Score 6  13  Anxiety Difficulty Somewhat difficult Somewhat difficult     Immunization History  Administered Date(s) Administered   Hep A / Hep B 07/02/1991, 08/25/1991, 02/25/1992   IPV 07/29/2006   Influenza Split 02/22/2013, 01/24/2015, 02/07/2016   Influenza, Seasonal, Injecte, Preservative Fre 01/22/2012, 02/09/2014, 01/14/2023   Influenza,inj,Quad PF,6+ Mos 02/19/2018   Influenza-Unspecified 02/17/2021   MMR 11/05/1982, 01/15/1989, 03/12/2012   Meningococcal Conjugate 03/12/2012   Moderna Covid-19 Vaccine Bivalent Booster 11yrs & up 03/20/2021   Moderna SARS-COV2 Booster Vaccination 02/04/2020   Moderna Sars-Covid-2 Vaccination 05/21/2019, 06/18/2019   PPD Test 04/27/2012   Td 05/22/1996   Td (Adult) 05/22/1996   Tdap 09/25/2005, 05/28/2016  Varicella 04/19/1988    Past Medical History:  Diagnosis Date   Chicken pox    Encounter for cosmetic surgery 09/03/2021   Jaundice    at birth (pre-term)   Myopia 09/03/2021   Situational stress 12/04/2021   Urinary incontinence    No Known Allergies Past Surgical History:  Procedure Laterality Date   NO PAST SURGERIES     Family History  Problem Relation Age of Onset   Asthma Mother    Thyroid  cancer Mother    Hypertension Father    Hyperlipidemia Father    Asthma Brother    Heart disease Maternal Grandmother    Hypertension Maternal Grandmother    Hyperlipidemia Maternal Grandmother    Heart attack Maternal Grandmother    Heart disease  Maternal Grandfather    Hypertension Maternal Grandfather    Hyperlipidemia Maternal Grandfather    Lung cancer Maternal Grandfather        smoker   Prostate cancer Maternal Grandfather    Heart attack Paternal Grandmother    Hypertension Paternal Grandmother    Hyperlipidemia Paternal Grandmother    Heart disease Paternal Grandmother    Uterine cancer Paternal Grandmother    Heart disease Paternal Grandfather    Hypertension Paternal Grandfather    Hyperlipidemia Paternal Grandfather    Kidney disease Paternal Grandfather    Healthy Daughter    Breast cancer Maternal Aunt    Social History   Social History Narrative   Marital status/children/pets: Single/Divorced,G2P1   Education/employment: MD, employed Radiographer, therapeutic:      -Wears a bicycle helmet riding a bike: Yes     -smoke alarm in the home:Yes     - wears seatbelt: Yes     - Feels safe in their relationships: Yes      Allergies as of 01/16/2024   No Known Allergies      Medication List        Accurate as of January 16, 2024  8:28 AM. If you have any questions, ask your nurse or doctor.          Clindamycin Phos-Benzoyl Perox 1.2-3.75 % Gel Apply topically.   clotrimazole  1 % cream Commonly known as: Clotrimazole  Anti-Fungal Apply 1 Application topically 2 (two) times daily. Started by: Shakeerah Gradel   drospirenone -ethinyl estradiol  3-0.03 MG tablet Commonly known as: Syeda  Take 1 tablet by mouth daily.   spironolactone  50 MG tablet Commonly known as: ALDACTONE  Take 1 tablet (50 mg total) by mouth daily.   tretinoin 0.025 % cream Commonly known as: RETIN-A Apply topically.   valACYclovir  1000 MG tablet Commonly known as: VALTREX  Take 2 tablets (2,000 mg total) by mouth 2 (two) times daily as needed. Cold sore prn        All past medical history, surgical history, allergies, family history, immunizations andmedications were updated in the EMR today and reviewed under the history and  medication portions of their EMR.     No results found for this or any previous visit (from the past 2160 hours).  Patient was never admitted.  ROS 14 pt review of systems performed and negative (unless mentioned in an HPI)  Objective: BP 118/80   Pulse 82   Temp 98.1 F (36.7 C)   Ht 5' 2 (1.575 m)   Wt 150 lb 3.2 oz (68.1 kg)   LMP 01/07/2024   SpO2 98%   BMI 27.47 kg/m  Physical Exam Vitals and nursing note reviewed.  Constitutional:      General: She  is not in acute distress.    Appearance: Normal appearance. She is not ill-appearing or toxic-appearing.  HENT:     Head: Normocephalic and atraumatic.     Right Ear: Tympanic membrane, ear canal and external ear normal. There is no impacted cerumen.     Left Ear: Tympanic membrane, ear canal and external ear normal. There is no impacted cerumen.     Nose: No congestion or rhinorrhea.     Mouth/Throat:     Mouth: Mucous membranes are moist.     Pharynx: Oropharynx is clear. No oropharyngeal exudate or posterior oropharyngeal erythema.  Eyes:     General: No scleral icterus.       Right eye: No discharge.        Left eye: No discharge.     Extraocular Movements: Extraocular movements intact.     Conjunctiva/sclera: Conjunctivae normal.     Pupils: Pupils are equal, round, and reactive to light.  Cardiovascular:     Rate and Rhythm: Normal rate and regular rhythm.     Pulses: Normal pulses.     Heart sounds: Normal heart sounds. No murmur heard.    No friction rub. No gallop.  Pulmonary:     Effort: Pulmonary effort is normal. No respiratory distress.     Breath sounds: Normal breath sounds. No stridor. No wheezing, rhonchi or rales.  Chest:     Chest wall: No tenderness.  Abdominal:     General: Abdomen is flat. Bowel sounds are normal. There is no distension.     Palpations: Abdomen is soft. There is no mass.     Tenderness: There is no abdominal tenderness. There is no right CVA tenderness, left CVA tenderness,  guarding or rebound.     Hernia: No hernia is present.  Musculoskeletal:        General: No swelling, tenderness or deformity. Normal range of motion.     Cervical back: Normal range of motion and neck supple. No rigidity or tenderness.     Right lower leg: No edema.     Left lower leg: No edema.  Lymphadenopathy:     Cervical: No cervical adenopathy.  Skin:    General: Skin is warm and dry.     Coloration: Skin is not jaundiced or pale.     Findings: No bruising, erythema, lesion or rash.  Neurological:     General: No focal deficit present.     Mental Status: She is alert and oriented to person, place, and time. Mental status is at baseline.     Cranial Nerves: No cranial nerve deficit.     Sensory: No sensory deficit.     Motor: No weakness.     Coordination: Coordination normal.     Gait: Gait normal.     Deep Tendon Reflexes: Reflexes normal.  Psychiatric:        Mood and Affect: Mood normal.        Behavior: Behavior normal.        Thought Content: Thought content normal.        Judgment: Judgment normal.      No results found.  Assessment/plan: Janet Braun is a 43 y.o. 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 Will refill after labs at appropriate dose.   Abnormal mammogram-09/29/2023/Mass of left breast, unspecified quadrant - MM 3D DIAGNOSTIC  MAMMOGRAM BILATERAL BREAST; Future - US  LIMITED ULTRASOUND INCLUDING AXILLA LEFT BREAST ; Future -Probably benign 5 mm mass in the 4:00 region of the left breast 3 cm from the nipple Bilateral diagnostic mammogram and left breast ultrasound in 1 year is recommended to document stability for a total of 2 years.  Second diagnostic if 09/2024  Uses oral contraceptives as primary birth control method/Long term current use of hormonal contraceptive Syeda  refilled  -  Hemoglobin A1c - Lipid panel  Lipid screening - Lipid panel Diabetes mellitus screening - Hemoglobin A1c  Encounter for preventive health examination Colonoscopy:No fhx  Routine screen at 46 Mammogram: fhx present in [facility]. Completed 09/26/2023>ordered today for [facility] Cervical cancer screening:12/10/2021-WNL-neg hpv. 5 yr recall. Dr. Catherine Immunizations: tdap UTD 2018, Influenza  (encouraged yearly) Infectious disease screening: HIV and Hep C completed DEXA:routine screen to start at age 62-65 Patient was encouraged to exercise greater than 150 minutes a week. Patient was encouraged to choose a diet filled with fresh fruits and vegetables, and lean meats. AVS provided to patient today for education/recommendation on gender specific health and safety maintenance.   Return in about 1 year (around 01/16/2025) for cpe (20 min), Routine chronic condition follow-up.   Orders Placed This Encounter  Procedures   MM 3D DIAGNOSTIC MAMMOGRAM BILATERAL BREAST   US  LIMITED ULTRASOUND INCLUDING AXILLA LEFT BREAST    CBC   Comprehensive metabolic panel with GFR   Hemoglobin A1c   Lipid panel   TSH   Meds ordered this encounter  Medications   drospirenone -ethinyl estradiol  (SYEDA ) 3-0.03 MG tablet    Sig: Take 1 tablet by mouth daily.    Dispense:  90 tablet    Refill:  4   valACYclovir  (VALTREX ) 1000 MG tablet    Sig: Take 2 tablets (2,000 mg total) by mouth 2 (two) times daily as needed. Cold sore prn    Dispense:  20 tablet    Refill:  3   clotrimazole  (CLOTRIMAZOLE  ANTI-FUNGAL) 1 % cream    Sig: Apply 1 Application topically 2 (two) times daily.    Dispense:  30 g    Refill:  5   Referral Orders  No referral(s) requested today     Electronically signed by: Charlies Catherine, DO [facility]

## 2024-01-21 ENCOUNTER — Ambulatory Visit: Payer: Self-pay | Admitting: Family Medicine

## 2024-01-21 MED ORDER — SPIRONOLACTONE 50 MG PO TABS: 75.0000 mg | ORAL_TABLET | Freq: Every day | ORAL | 1 refills | Status: AC

## 2024-02-18 ENCOUNTER — Other Ambulatory Visit: Payer: Self-pay | Admitting: Family Medicine

## 2024-03-11 DIAGNOSIS — L814 Other melanin hyperpigmentation: Secondary | ICD-10-CM | POA: Diagnosis not present

## 2024-03-11 DIAGNOSIS — D2272 Melanocytic nevi of left lower limb, including hip: Secondary | ICD-10-CM | POA: Diagnosis not present

## 2024-03-11 DIAGNOSIS — D2372 Other benign neoplasm of skin of left lower limb, including hip: Secondary | ICD-10-CM | POA: Diagnosis not present

## 2024-03-11 DIAGNOSIS — D485 Neoplasm of uncertain behavior of skin: Secondary | ICD-10-CM | POA: Diagnosis not present

## 2024-03-11 DIAGNOSIS — L578 Other skin changes due to chronic exposure to nonionizing radiation: Secondary | ICD-10-CM | POA: Diagnosis not present

## 2024-03-11 DIAGNOSIS — D229 Melanocytic nevi, unspecified: Secondary | ICD-10-CM | POA: Diagnosis not present

## 2024-03-18 DIAGNOSIS — L905 Scar conditions and fibrosis of skin: Secondary | ICD-10-CM | POA: Diagnosis not present

## 2024-03-18 DIAGNOSIS — D239 Other benign neoplasm of skin, unspecified: Secondary | ICD-10-CM | POA: Diagnosis not present

## 2024-03-18 DIAGNOSIS — D2372 Other benign neoplasm of skin of left lower limb, including hip: Secondary | ICD-10-CM | POA: Diagnosis not present

## 2024-09-30 ENCOUNTER — Other Ambulatory Visit

## 2024-09-30 ENCOUNTER — Encounter

## 2025-01-20 ENCOUNTER — Encounter: Admitting: Family Medicine
